# Patient Record
Sex: Female | Born: 1960 | Race: White | Hispanic: No | Marital: Single | Smoking: Never smoker
Health system: Southern US, Community
[De-identification: ages and names within clinical notes are randomized; demographics above are authoritative.]

## PROBLEM LIST (undated history)

## (undated) DIAGNOSIS — R51 Headache: Secondary | ICD-10-CM

## (undated) DIAGNOSIS — R519 Headache, unspecified: Secondary | ICD-10-CM

## (undated) DIAGNOSIS — M1990 Unspecified osteoarthritis, unspecified site: Secondary | ICD-10-CM

## (undated) DIAGNOSIS — E039 Hypothyroidism, unspecified: Secondary | ICD-10-CM

## (undated) DIAGNOSIS — I10 Essential (primary) hypertension: Secondary | ICD-10-CM

## (undated) DIAGNOSIS — T63001A Toxic effect of unspecified snake venom, accidental (unintentional), initial encounter: Secondary | ICD-10-CM

## (undated) DIAGNOSIS — Z87442 Personal history of urinary calculi: Secondary | ICD-10-CM

## (undated) DIAGNOSIS — Z8489 Family history of other specified conditions: Secondary | ICD-10-CM

## (undated) DIAGNOSIS — G4730 Sleep apnea, unspecified: Secondary | ICD-10-CM

---

## 2004-01-14 ENCOUNTER — Ambulatory Visit: Payer: Self-pay | Admitting: Internal Medicine

## 2004-01-27 ENCOUNTER — Ambulatory Visit: Payer: Self-pay | Admitting: Internal Medicine

## 2005-06-15 ENCOUNTER — Ambulatory Visit: Payer: Self-pay | Admitting: Internal Medicine

## 2005-09-27 ENCOUNTER — Ambulatory Visit: Payer: Self-pay | Admitting: Internal Medicine

## 2006-07-09 ENCOUNTER — Ambulatory Visit: Payer: Self-pay | Admitting: Nurse Practitioner

## 2006-12-31 ENCOUNTER — Ambulatory Visit: Payer: Self-pay | Admitting: Gastroenterology

## 2007-09-17 ENCOUNTER — Ambulatory Visit: Payer: Self-pay | Admitting: Nurse Practitioner

## 2008-10-29 ENCOUNTER — Ambulatory Visit: Payer: Self-pay | Admitting: Nurse Practitioner

## 2009-12-21 ENCOUNTER — Ambulatory Visit: Payer: Self-pay | Admitting: Family Medicine

## 2011-10-02 DIAGNOSIS — I10 Essential (primary) hypertension: Secondary | ICD-10-CM | POA: Insufficient documentation

## 2012-02-07 DIAGNOSIS — T63001A Toxic effect of unspecified snake venom, accidental (unintentional), initial encounter: Secondary | ICD-10-CM

## 2012-02-07 HISTORY — DX: Toxic effect of unspecified snake venom, accidental (unintentional), initial encounter: T63.001A

## 2012-10-11 ENCOUNTER — Emergency Department: Payer: Self-pay | Admitting: Emergency Medicine

## 2012-10-11 LAB — CBC WITH DIFFERENTIAL/PLATELET
Basophil %: 1 %
Eosinophil #: 0.2 10*3/uL (ref 0.0–0.7)
Eosinophil %: 4.2 %
HGB: 13.8 g/dL (ref 12.0–16.0)
Lymphocyte %: 30.3 %
MCH: 30.2 pg (ref 26.0–34.0)
Monocyte %: 7.9 %
Neutrophil #: 2.8 10*3/uL (ref 1.4–6.5)
Platelet: 234 10*3/uL (ref 150–440)
RBC: 4.56 10*6/uL (ref 3.80–5.20)
RDW: 12.6 % (ref 11.5–14.5)

## 2012-10-11 LAB — FIBRINOGEN: Fibrinogen: 276 mg/dL (ref 210–470)

## 2012-10-11 LAB — PROTIME-INR: INR: 0.9

## 2012-10-28 DIAGNOSIS — R600 Localized edema: Secondary | ICD-10-CM | POA: Insufficient documentation

## 2014-01-22 ENCOUNTER — Ambulatory Visit: Payer: Self-pay | Admitting: Unknown Physician Specialty

## 2014-05-29 DIAGNOSIS — G4733 Obstructive sleep apnea (adult) (pediatric): Secondary | ICD-10-CM | POA: Insufficient documentation

## 2014-05-30 DIAGNOSIS — E039 Hypothyroidism, unspecified: Secondary | ICD-10-CM | POA: Insufficient documentation

## 2014-05-30 DIAGNOSIS — R7402 Elevation of levels of lactic acid dehydrogenase (LDH): Secondary | ICD-10-CM | POA: Insufficient documentation

## 2014-08-20 ENCOUNTER — Other Ambulatory Visit: Payer: Self-pay | Admitting: Family Medicine

## 2014-08-20 DIAGNOSIS — Z1239 Encounter for other screening for malignant neoplasm of breast: Secondary | ICD-10-CM

## 2014-10-23 ENCOUNTER — Encounter: Payer: Self-pay | Admitting: *Deleted

## 2014-10-26 ENCOUNTER — Ambulatory Visit: Payer: BC Managed Care – PPO | Admitting: Anesthesiology

## 2014-10-26 ENCOUNTER — Encounter: Payer: Self-pay | Admitting: *Deleted

## 2014-10-26 ENCOUNTER — Encounter
Admission: RE | Disposition: A | Discharge status: Home / Self Care | Payer: Self-pay | Source: Ambulatory Visit | Attending: Unknown Physician Specialty

## 2014-10-26 ENCOUNTER — Ambulatory Visit
Admission: RE | Admit: 2014-10-26 | Discharge: 2014-10-26 | Disposition: A | Discharge status: Home / Self Care | Payer: BC Managed Care – PPO | Source: Ambulatory Visit | Attending: Unknown Physician Specialty | Admitting: Unknown Physician Specialty

## 2014-10-26 DIAGNOSIS — Z800 Family history of malignant neoplasm of digestive organs: Secondary | ICD-10-CM | POA: Insufficient documentation

## 2014-10-26 DIAGNOSIS — Z79899 Other long term (current) drug therapy: Secondary | ICD-10-CM | POA: Insufficient documentation

## 2014-10-26 DIAGNOSIS — K621 Rectal polyp: Secondary | ICD-10-CM | POA: Insufficient documentation

## 2014-10-26 DIAGNOSIS — D123 Benign neoplasm of transverse colon: Secondary | ICD-10-CM | POA: Insufficient documentation

## 2014-10-26 DIAGNOSIS — E039 Hypothyroidism, unspecified: Secondary | ICD-10-CM | POA: Diagnosis not present

## 2014-10-26 DIAGNOSIS — Z7982 Long term (current) use of aspirin: Secondary | ICD-10-CM | POA: Diagnosis not present

## 2014-10-26 DIAGNOSIS — K640 First degree hemorrhoids: Secondary | ICD-10-CM | POA: Insufficient documentation

## 2014-10-26 DIAGNOSIS — G4730 Sleep apnea, unspecified: Secondary | ICD-10-CM | POA: Diagnosis not present

## 2014-10-26 DIAGNOSIS — Z1211 Encounter for screening for malignant neoplasm of colon: Secondary | ICD-10-CM | POA: Insufficient documentation

## 2014-10-26 HISTORY — PX: COLONOSCOPY WITH PROPOFOL: SHX5780

## 2014-10-26 HISTORY — DX: Sleep apnea, unspecified: G47.30

## 2014-10-26 HISTORY — DX: Hypothyroidism, unspecified: E03.9

## 2014-10-26 SURGERY — COLONOSCOPY WITH PROPOFOL
Anesthesia: General

## 2014-10-26 MED ORDER — MIDAZOLAM HCL 5 MG/5ML IJ SOLN
INTRAMUSCULAR | Status: DC | PRN
  Administered 2014-10-26 (×2): 1 mg via INTRAVENOUS

## 2014-10-26 MED ORDER — PHENYLEPHRINE HCL 10 MG/ML IJ SOLN
INTRAMUSCULAR | Status: DC | PRN
Start: 2014-10-26 — End: 2014-10-26
  Administered 2014-10-26 (×2): 100 ug via INTRAVENOUS

## 2014-10-26 MED ORDER — SODIUM CHLORIDE 0.9 % IV SOLN: INTRAVENOUS | Status: DC

## 2014-10-26 MED ORDER — PROPOFOL INFUSION 10 MG/ML OPTIME
INTRAVENOUS | Status: DC | PRN
  Administered 2014-10-26: 100 ug/kg/min via INTRAVENOUS

## 2014-10-26 MED ORDER — FENTANYL CITRATE (PF) 100 MCG/2ML IJ SOLN
INTRAMUSCULAR | Status: DC | PRN
  Administered 2014-10-26: 50 ug via INTRAVENOUS

## 2014-10-26 MED ORDER — SODIUM CHLORIDE 0.9 % IV SOLN
INTRAVENOUS | Status: DC
  Administered 2014-10-26: 10:00:00 via INTRAVENOUS

## 2014-10-26 MED ORDER — PROPOFOL 10 MG/ML IV BOLUS
INTRAVENOUS | Status: DC | PRN
  Administered 2014-10-26: 10 mg via INTRAVENOUS
  Administered 2014-10-26: 20 mg via INTRAVENOUS
  Administered 2014-10-26: 10 mg via INTRAVENOUS
  Administered 2014-10-26: 20 mg via INTRAVENOUS
  Administered 2014-10-26 (×2): 30 mg via INTRAVENOUS
  Administered 2014-10-26: 50 mg via INTRAVENOUS

## 2014-10-26 MED ORDER — LIDOCAINE HCL (PF) 2 % IJ SOLN
INTRAMUSCULAR | Status: DC | PRN
  Administered 2014-10-26: 50 mg

## 2014-10-26 NOTE — Anesthesia Postprocedure Evaluation (Signed)
  Anesthesia Post-op Note  Patient: Theresa Thomas  Procedure(s) Performed: Procedure(s): COLONOSCOPY WITH PROPOFOL (N/A)  Anesthesia type:General  Patient location: PACU  Post pain: Pain level controlled  Post assessment: Post-op Vital signs reviewed, Patient's Cardiovascular Status Stable, Respiratory Function Stable, Patent Airway and No signs of Nausea or vomiting  Post vital signs: Reviewed and stable  Last Vitals:  Filed Vitals:   10/26/14 1042  BP: 108/78  Pulse: 103  Temp: 35.9 C  Resp: 21    Level of consciousness: awake, alert  and patient cooperative  Complications: No apparent anesthesia complications

## 2014-10-26 NOTE — Op Note (Signed)
[facility] Gastroenterology Patient Name: Theresa Thomas Procedure Date: 10/26/2014 9:55 AM MRN: [id number] Account #: [id number] Date of Birth: [birth year] Admit Type: Outpatient Age: 54 Room: [facility] ENDO ROOM 1 Gender: Female Note Status: Finalized Procedure:         Colonoscopy Indications:       Screening in patient at increased risk: Family history of                     1st-degree relative with colorectal cancer Providers:         Manya Silvas, MD Referring MD:      [facility]Barton Dubois (Referring MD) Medicines:         Propofol per Anesthesia Complications:     No immediate complications. Procedure:         Pre-Anesthesia Assessment:                    - After reviewing the risks and benefits, the patient was                     deemed in satisfactory condition to undergo the procedure.                    After obtaining informed consent, the colonoscope was                     passed under direct vision. Throughout the procedure, the                     patient's blood pressure, pulse, and oxygen saturations                     were monitored continuously. The Olympus PCF-H180AL                     colonoscope ( S#: [id number] ) was introduced through the                     anus and advanced to the the cecum, identified by                     appendiceal orifice and ileocecal valve. The colonoscopy                     was performed without difficulty. The patient tolerated                     the procedure well. The quality of the bowel preparation                     was excellent. Findings:      The colon is very long and redundant, requiring external pressure      A small polyp was found in the transverse colon. The polyp was sessile.       The polyp was removed with a jumbo cold forceps. Resection and retrieval       were complete. To prevent bleeding after the polypectomy, one hemostatic       clip was successfully placed. There was no  bleeding at the end of the       procedure.      A diminutive polyp was found in the rectum. The polyp was sessile. The       polyp was removed with a jumbo cold  forceps. Resection and retrieval       were complete.      Internal hemorrhoids were found during endoscopy. The hemorrhoids were       small and Grade I (internal hemorrhoids that do not prolapse).      The exam was otherwise without abnormality. Impression:        - One small polyp in the transverse colon. Resected and                     retrieved. Clip was placed.                    - One diminutive polyp in the rectum. Resected and                     retrieved.                    - Internal hemorrhoids.                    - The examination was otherwise normal. Recommendation:    - Await pathology results. Procedure Code(s): --- Professional ---                    [phone], Colonoscopy, flexible; with biopsy, single or                     multiple CPT copyright 2014 American Medical Association. All rights reserved. The codes documented in this report are preliminary and upon coder review may  be revised to meet current compliance requirements. Manya Silvas, MD 10/26/2014 10:43:46 AM This report has been signed electronically. Number of Addenda: 0 Note Initiated On: 10/26/2014 9:55 AM Scope Withdrawal Time: 0 hours 12 minutes 26 seconds  Total Procedure Duration: 0 hours 31 minutes 51 seconds       [facility]

## 2014-10-26 NOTE — Transfer of Care (Signed)
Immediate Anesthesia Transfer of Care Note  Patient: Theresa Thomas  Procedure(s) Performed: Procedure(s): COLONOSCOPY WITH PROPOFOL (N/A)  Patient Location: PACU  Anesthesia Type:General  Level of Consciousness: sedated  Airway & Oxygen Therapy: Patient Spontanous Breathing and Patient connected to nasal cannula oxygen  Post-op Assessment: Report given to RN and Post -op Vital signs reviewed and stable  Post vital signs: Reviewed and stable  Last Vitals:  Filed Vitals:   10/26/14 1042  BP: 108/78  Pulse: 103  Temp: 35.9 C  Resp: 21    Complications: No apparent anesthesia complications

## 2014-10-26 NOTE — Anesthesia Preprocedure Evaluation (Signed)
Anesthesia Evaluation  Patient identified by MRN, date of birth, ID band Patient awake    Reviewed: Allergy & Precautions, NPO status , Patient's Chart, lab work & pertinent test results  Airway Mallampati: II  TM Distance: >3 FB Neck ROM: Full    Dental  (+) Teeth Intact   Pulmonary sleep apnea and Continuous Positive Airway Pressure Ventilation ,    Pulmonary exam normal breath sounds clear to auscultation       Cardiovascular Exercise Tolerance: Good hypertension, Pt. on medications and Pt. on home beta blockers Normal cardiovascular exam Rhythm:Regular     Neuro/Psych    GI/Hepatic negative GI ROS,   Endo/Other  Hypothyroidism   Renal/GU      Musculoskeletal   Abdominal   Peds  Hematology   Anesthesia Other Findings   Reproductive/Obstetrics                             Anesthesia Physical Anesthesia Plan  ASA: III  Anesthesia Plan: General   Post-op Pain Management:    Induction: Intravenous  Airway Management Planned: Nasal Cannula  Additional Equipment:   Intra-op Plan:   Post-operative Plan:   Informed Consent: I have reviewed the patients History and Physical, chart, labs and discussed the procedure including the risks, benefits and alternatives for the proposed anesthesia with the patient or authorized representative who has indicated his/her understanding and acceptance.     Plan Discussed with: CRNA  Anesthesia Plan Comments:         Anesthesia Quick Evaluation

## 2014-10-26 NOTE — H&P (Signed)
   Primary Care Physician:  Lavonne Chick, MD Primary Gastroenterologist:  Dr. Vira Agar  Pre-Procedure History & Physical: HPI:  Theresa Thomas is a 54 y.o. female is here for an colonoscopy.   Past Medical History  Diagnosis Date  . Sleep apnea   . Hypothyroidism     History reviewed. No pertinent past surgical history.  Prior to Admission medications   Medication Sig Start Date End Date Taking? Authorizing Provider  aspirin EC 81 MG tablet Take 81 mg by mouth daily.   Yes Historical Provider, MD  atenolol (TENORMIN) 50 MG tablet Take 50 mg by mouth daily.   Yes Historical Provider, MD  calcium carbonate (OSCAL) 1500 (600 CA) MG TABS tablet Take by mouth daily with breakfast.   Yes Historical Provider, MD  furosemide (LASIX) 20 MG tablet Take 20 mg by mouth.   Yes Historical Provider, MD  levothyroxine (SYNTHROID, LEVOTHROID) 50 MCG tablet Take 50 mcg by mouth daily before breakfast.   Yes Historical Provider, MD  Multiple Vitamin (MULTIVITAMIN) tablet Take 1 tablet by mouth daily.   Yes Historical Provider, MD  omega-3 acid ethyl esters (LOVAZA) 1 G capsule Take by mouth daily.   Yes Historical Provider, MD  Turmeric, Curcuma [place]a, ([place]) POWD by Does not apply route.   Yes Historical Provider, MD    Allergies as of 09/23/2014  . (Not on File)    History reviewed. No pertinent family history.  Social History   Social History  . Marital Status: Married    Spouse Name: N/A  . Number of Children: N/A  . Years of Education: N/A   Occupational History  . Not on file.   Social History Main Topics  . Smoking status: Never Smoker   . Smokeless tobacco: Never Used  . Alcohol Use: 1.2 oz/week    2 Cans of beer per week  . Drug Use: No  . Sexual Activity: Not on file   Other Topics Concern  . Not on file   Social History Narrative    Review of Systems: See HPI, otherwise negative ROS  Physical Exam: BP 125/79 mmHg  Pulse 110  Temp(Src) 98.9 F  (37.2 C) (Tympanic)  Resp 20  Ht 5\' 8"  (1.727 m)  Wt 113.399 kg (250 lb)  BMI 38.02 kg/m2  SpO2 99% General:   Alert,  pleasant and cooperative in NAD Head:  Normocephalic and atraumatic. Neck:  Supple; no masses or thyromegaly. Lungs:  Clear throughout to auscultation.    Heart:  Regular rate and rhythm. Abdomen:  Soft, nontender and nondistended. Normal bowel sounds, without guarding, and without rebound.   Neurologic:  Alert and  oriented x4;  grossly normal neurologically.  Impression/Plan: Theresa Thomas is here for an colonoscopy to be performed for screening due to family history of colon cancer in mother  Risks, benefits, limitations, and alternatives regarding  colonoscopy have been reviewed with the patient.  Questions have been answered.  All parties agreeable.   Gaylyn Cheers, MD  10/26/2014, 9:52 AM

## 2014-10-26 NOTE — Addendum Note (Signed)
Addendum  created 10/26/14 1100 by Letitia Neri, CRNA   Modules edited: Anesthesia Flowsheet

## 2014-10-27 LAB — SURGICAL PATHOLOGY

## 2014-10-28 ENCOUNTER — Encounter: Payer: Self-pay | Admitting: Unknown Physician Specialty

## 2015-06-15 ENCOUNTER — Other Ambulatory Visit: Payer: Self-pay | Admitting: Orthopedic Surgery

## 2015-06-15 DIAGNOSIS — M2392 Unspecified internal derangement of left knee: Secondary | ICD-10-CM

## 2015-06-15 DIAGNOSIS — M1712 Unilateral primary osteoarthritis, left knee: Secondary | ICD-10-CM

## 2015-06-15 DIAGNOSIS — M25562 Pain in left knee: Secondary | ICD-10-CM

## 2015-06-30 ENCOUNTER — Ambulatory Visit
Admission: RE | Admit: 2015-06-30 | Discharge: 2015-06-30 | Disposition: A | Discharge status: Home / Self Care | Payer: BC Managed Care – PPO | Source: Ambulatory Visit | Attending: Orthopedic Surgery | Admitting: Orthopedic Surgery

## 2015-06-30 DIAGNOSIS — M1712 Unilateral primary osteoarthritis, left knee: Secondary | ICD-10-CM | POA: Insufficient documentation

## 2015-06-30 DIAGNOSIS — M899 Disorder of bone, unspecified: Secondary | ICD-10-CM | POA: Diagnosis not present

## 2015-06-30 DIAGNOSIS — M659 Synovitis and tenosynovitis, unspecified: Secondary | ICD-10-CM | POA: Diagnosis not present

## 2015-06-30 DIAGNOSIS — M2392 Unspecified internal derangement of left knee: Secondary | ICD-10-CM | POA: Insufficient documentation

## 2015-06-30 DIAGNOSIS — M25462 Effusion, left knee: Secondary | ICD-10-CM | POA: Diagnosis not present

## 2015-06-30 DIAGNOSIS — M23222 Derangement of posterior horn of medial meniscus due to old tear or injury, left knee: Secondary | ICD-10-CM | POA: Diagnosis not present

## 2015-06-30 DIAGNOSIS — M25562 Pain in left knee: Secondary | ICD-10-CM | POA: Insufficient documentation

## 2015-07-09 DIAGNOSIS — S83209A Unspecified tear of unspecified meniscus, current injury, unspecified knee, initial encounter: Secondary | ICD-10-CM | POA: Insufficient documentation

## 2016-01-14 NOTE — Patient Instructions (Signed)
  Your procedure is scheduled on: 01-20-16 [facility]) Report to Same Day Surgery 2nd floor medical mall [facility] Entrance-take elevator on left to 2nd floor.  Check in with surgery information desk.) To find out your arrival time please call [phone] between 1PM - 3PM on  01-19-16 [facility])  Remember: Instructions that are not followed completely may result in serious medical risk, up to and including death, or upon the discretion of your surgeon and anesthesiologist your surgery may need to be rescheduled.    _x___ 1. Do not eat food or drink liquids after midnight. No gum chewing or hard candies.     __x__ 2. No Alcohol for 24 hours before or after surgery.   __x__3. No Smoking for 24 prior to surgery.   ____  4. Bring all medications with you on the day of surgery if instructed.    __x__ 5. Notify your doctor if there is any change in your medical condition     (cold, fever, infections).     Do not wear jewelry, make-up, hairpins, clips or nail polish.  Do not wear lotions, powders, or perfumes. You may wear deodorant.  Do not shave 48 hours prior to surgery. Men may shave face and neck.  Do not bring valuables to the hospital.    [facility] is not responsible for any belongings or valuables.               Contacts, dentures or bridgework may not be worn into surgery.  Leave your suitcase in the car. After surgery it may be brought to your room.  For patients admitted to the hospital, discharge time is determined by your treatment team.   Patients discharged the day of surgery will not be allowed to drive home.  You will need someone to drive you home and stay with you the night of your procedure.    Please read over the following fact sheets that you were given:   [facility] Preparing for Surgery and or MRSA Information   _x___ Take these medicines the morning of surgery with A SIP OF WATER:    1. ATENOLOL  2.  3.  4.  5.  6.  ____Fleets enema or Magnesium  Citrate as directed.   _x___ Use CHG Soap or sage wipes as directed on instruction sheet   ____ Use inhalers on the day of surgery and bring to hospital day of surgery  ____ Stop metformin 2 days prior to surgery    ____ Take 1/2 of usual insulin dose the night before surgery and none on the morning of surgery.   _X___ Stop Aspirin, Coumadin, Pllavix ,Eliquis, Effient, or Pradaxa-STOP ASPIRIN NOW  x__ Stop Anti-inflammatories such as Advil, Aleve, Ibuprofen, Motrin, Naproxen,          Naprosyn, Goodies powders or aspirin products NOW-Ok to take Tylenol.   _X___ Stop supplements until after surgery-STOP FISH OIL AND TURMERIC NOW  ____ Bring C-Pap to the hospital.

## 2016-01-17 ENCOUNTER — Encounter
Admission: RE | Admit: 2016-01-17 | Discharge: 2016-01-17 | Disposition: A | Discharge status: Home / Self Care | Payer: BC Managed Care – PPO | Source: Ambulatory Visit | Attending: Unknown Physician Specialty | Admitting: Unknown Physician Specialty

## 2016-01-17 DIAGNOSIS — Z01810 Encounter for preprocedural cardiovascular examination: Secondary | ICD-10-CM | POA: Insufficient documentation

## 2016-01-17 DIAGNOSIS — S83242A Other tear of medial meniscus, current injury, left knee, initial encounter: Secondary | ICD-10-CM | POA: Diagnosis present

## 2016-01-17 DIAGNOSIS — E039 Hypothyroidism, unspecified: Secondary | ICD-10-CM | POA: Diagnosis not present

## 2016-01-17 DIAGNOSIS — M1712 Unilateral primary osteoarthritis, left knee: Secondary | ICD-10-CM | POA: Diagnosis not present

## 2016-01-17 DIAGNOSIS — Z6841 Body Mass Index (BMI) 40.0 and over, adult: Secondary | ICD-10-CM | POA: Diagnosis not present

## 2016-01-17 DIAGNOSIS — Z7982 Long term (current) use of aspirin: Secondary | ICD-10-CM | POA: Diagnosis not present

## 2016-01-17 DIAGNOSIS — I10 Essential (primary) hypertension: Secondary | ICD-10-CM

## 2016-01-17 DIAGNOSIS — G4730 Sleep apnea, unspecified: Secondary | ICD-10-CM | POA: Diagnosis not present

## 2016-01-17 DIAGNOSIS — Z79899 Other long term (current) drug therapy: Secondary | ICD-10-CM | POA: Diagnosis not present

## 2016-01-17 DIAGNOSIS — X58XXXA Exposure to other specified factors, initial encounter: Secondary | ICD-10-CM | POA: Diagnosis not present

## 2016-01-17 HISTORY — DX: Toxic effect of unspecified snake venom, accidental (unintentional), initial encounter: T63.001A

## 2016-01-17 HISTORY — DX: Headache: R51

## 2016-01-17 HISTORY — DX: Essential (primary) hypertension: I10

## 2016-01-17 HISTORY — DX: Headache, unspecified: R51.9

## 2016-01-17 NOTE — Pre-Procedure Instructions (Signed)
Called Dr Ronelle Nigh regarding abnormal EKG- Dr Ronelle Nigh said to send EKG over to pts PCP and have him evaluate EKG

## 2016-01-17 NOTE — Pre-Procedure Instructions (Signed)
CALLED OVER TO DR SHARPE'S OFFICE AND HAD TO LEAVE A MESSAGE WITH THE NURSE ABOUT NEEDING DR SHARPE TO LOOK AT EKG AND DETERMINE IF PT IS OK FOR SURGERY.  I FAXED EKG OVER TO OFFICE AND RECEIVED FAX CONFIRMATION THAT IT WENT THRU

## 2016-01-18 ENCOUNTER — Telehealth: Payer: Self-pay

## 2016-01-18 DIAGNOSIS — I10 Essential (primary) hypertension: Secondary | ICD-10-CM | POA: Insufficient documentation

## 2016-01-18 DIAGNOSIS — R9431 Abnormal electrocardiogram [ECG] [EKG]: Secondary | ICD-10-CM | POA: Insufficient documentation

## 2016-01-18 DIAGNOSIS — I493 Ventricular premature depolarization: Secondary | ICD-10-CM | POA: Insufficient documentation

## 2016-01-18 NOTE — Telephone Encounter (Signed)
Lmov for patient to call back  Received a referral from [facility] center for abnormal EKG

## 2016-01-18 NOTE — Pre-Procedure Instructions (Signed)
Nurse from [facility] called and said that Dr Tobin Chad looked at EKG and does not feel comfortable clearing pt for surgery.  Nurse asked if we could get cardiology to look at EKG- I told her that if Dr Tobin Chad wants pt to get cleared by cardiology then they need to refer pt to a cardiologist. Nurse said Dr Tobin Chad was out of office today but that she will speak with Dr Tobin Chad and let me know.

## 2016-01-18 NOTE — Pre-Procedure Instructions (Signed)
Theresa Thomas AND STATED THAT SHE GOT PT IN TO SEE DR Nehemiah Massed TODAY FOR CARDIAC CLEARANCE.

## 2016-01-18 NOTE — Pre-Procedure Instructions (Signed)
RECEIVED CARDIAC CLEARANCE FROM DR Hardie Lora RISK FOR SURGERY (ON CHART)

## 2016-01-18 NOTE — Pre-Procedure Instructions (Signed)
Called and left voicemail for Theresa Thomas at [facility] office regarding EKG- I faxed this over to their office with confirmation that fax was received

## 2016-01-19 NOTE — Pre-Procedure Instructions (Signed)
Progress Notes - in this encounter  Theresa Dibble, MD - 01/18/2016 11:30 AM EST Formatting of this note may be different from the original. Established Patient Visit   Chief Complaint: Chief Complaint  Patient presents with  . Follow-up  POC orthoscopic knee surgery 01/20/16  . Limb Pain  . Abnormal ECG  Date of Service: 01/18/2016 Date of Birth: [birth year] PCP: Lavonne Chick, NP  History of Present Illness: Ms. Theresa Thomas is a 55 y.o.female patient  Preventricular contractions The patient has pre ventricular contractions on Holter monitor in the past which appeared to be benign in nature and are not frequent or causing significant amount of symptoms at this time. There has been a discussion of other etiology and treatment options and we will continue to follow closely. Limb pain The patient has left lower knee extremity pain with and without physical activity. These symptoms are worsening with increased severity over the last 4 months relieved by nothing in particular. Other associated signs and symptoms include swelling, rubor, disability, instability and tenderness  Essential hypertension The patient has been on medication management listed below for essential hypertension. Reported average blood pressure readings recently have shown that the blood pressure is stable. The patient has not had any side effects of these medications at this time. We have discussed treatment goals and the patient for which they understand and agree with medication and lifestyle management. Currently there is no apparent secondary causes of the hypertension  Abnormal ECG NSR and septal ischemia with pvc and no hx of infarction in past and most consistant with abnormal lead placement Preoperative Assessment Profile for orthopedic surgery Risk factors for cardiac complication with surgery: Positive for: none Negative for: Diabetes, Cardiomyopathy or chf, Coronary artery disease, Chronic Kidney disease and  Peripheral vascular disease Active cardiovascular conditions: Positive [id number] Negative for: Angina or anginal equivalent, Recent infartion, Congestive heart failure symptoms, Dysrhythmia and Symptomatic valve disease Functional capacity >4 Mets Risk of surgery and/or procedure low risk Possible need and adjustments in therapy prior to surgery no Overall risk of cardiac complication with surgery and/or procedure is low, <1%  Past Medical and Surgical History  Past Medical History Past Medical History:  Diagnosis Date  . Hypertension  . Osteoarthritis   Past Surgical History She has a past surgical history that includes Colonoscopy (12/31/2006) and Colonoscopy (10/26/2014).   Medications and Allergies  Current Medications  Current Outpatient Prescriptions  Medication Sig Dispense Refill  . aspirin 81 MG EC tablet Take 81 mg by mouth once daily.  Marland Kitchen atenolol (TENORMIN) 50 MG tablet Take 50 mg by mouth once daily.  . calcium carbonate-vitamin D3 (CALTRATE 600+D) 600 mg(1,500mg ) -400 unit tablet Take 1 tablet by mouth once daily.  . ergocalciferol, vitamin D2, 50,000 unit capsule Take 50,000 Units by mouth once a week.  . FUROsemide (LASIX) 20 MG tablet Take 20 mg by mouth once daily as needed for Edema.  Marland Kitchen levothyroxine (SYNTHROID, LEVOTHROID) 50 MCG tablet Take 50 mcg by mouth once daily. Take on an empty stomach with a glass of water at least 30-60 minutes before breakfast.  . MULTIVITAMIN ORAL Take by mouth once daily.  Marland Kitchen omega-3 fatty acids-vitamin E 1,000 mg Take by mouth once daily.   No current facility-administered medications for this visit.   Allergies: Xylocaine [lidocaine hcl]  Social and Family History  Social History reports that she has never smoked. She has never used smokeless tobacco. She reports that she does not drink alcohol.  Family History History  reviewed. No pertinent family history.  Review of Systems   Review of Systems  Positive for limb pain  sob Negative for weight gain weight loss, weakness, vision change, hearing loss, cough, congestion, PND, orthopnea, heartburn, nausea, diaphoresis, vomiting, diarrhea, bloody stool, melena, stomach pain, extremity pain, leg weakness, leg cramping, leg blood clots, headache, blackouts, nosebleed, trouble swallowing, mouth pain, urinary frequency, urination at night, muscle weakness, skin lesions, skin rashes, tingling ,ulcers, numbness, anxiety, and/or depression Physical Examination   Vitals:BP 130/84  Pulse 68  Resp 14  Ht 172.7 cm (5\' 8" )  Wt (!) 119.3 kg (263 lb)  SpO2 100%  BMI 39.99 kg/m  Ht:172.7 cm (5\' 8" ) Wt:(!) 119.3 kg (263 lb) [id number] surface area is 2.39 meters squared. Body mass index is 39.99 kg/m. Appearance: well appearing in no acute distress HEENT: Pupils equally reactive to light and accomodation, no xanthalasma  Neck: Supple, no apparent thyromegaly, masses, or lymphadenopathy  Lungs: normal respiratory effort; no crackles, no rhonchi, no wheezes Heart: Regular rate and rhythm. Normal S1 S2 No gallops, murmur, no rub, PMI is normal size and placement. carotid upstroke normal without bruit. Jugular venous pressure is normal Abdomen: soft, nontender, not distended with normal bowel sounds. No apparent hepatosplenomegally. Abdominal aorta is normal size without bruit Extremities: no edema, no ulcers, no clubbing, no cyanosis Peripheral Pulses: 2+ in upper extremities, 2+ femoral pulses bilaterally, 2+lower extremity  Musculoskeletal; Normal muscle tone without kyphosis Neurological: Oriented and Alert, Cranial nerves intact  Assessment   55 y.o. female with  Encounter Diagnoses  Name Primary?  . Primary osteoarthritis of left knee Yes  . Frequent PVCs  . Abnormal ECG  . Benign essential HTN   Plan  -Proceed to surgery and/or invasive procedure without restriction to pre or post operative and/or procedural care. The patient is at lowest risk possible for  cardiovascular complications with surgical intervention and/or invasive procedure. Currently has no evidence active and/or significant angina and/or congestive heart failure. The patient may discontinue aspirin 7 days prior to procedure and restart at a safe period thereafter -Continue current medical regimen for hypertension control which is stable at this time and without apparent significant side effects or symptoms of medications at this time. Further treatment goals of low sodium diet for additive effects of these medications have been discussed today as well. -No further intervention of preventricular contractions which appear to be benign in nature. Continue diet and exercise as well as treatment of lifestyle actions which may help above symptoms. Other consideration of medical management as needed if patient has significant symptoms in the future. -We have had a long discussion about the benefits of physical and occupational rehabilitation. The patient is advised and encouraged to enroll for improvements in quality of life and reduced hospitalization.  Orders Placed This Encounter  Procedures  . Echo complete   Return in about 6 months (around 07/18/2016).  Theresa Dibble, MD

## 2016-01-20 ENCOUNTER — Ambulatory Visit
Admission: RE | Admit: 2016-01-20 | Discharge: 2016-01-20 | Disposition: A | Discharge status: Home / Self Care | Payer: BC Managed Care – PPO | Source: Ambulatory Visit | Attending: Unknown Physician Specialty | Admitting: Unknown Physician Specialty

## 2016-01-20 ENCOUNTER — Encounter: Payer: Self-pay | Admitting: *Deleted

## 2016-01-20 ENCOUNTER — Encounter
Admission: RE | Disposition: A | Discharge status: Home / Self Care | Payer: Self-pay | Source: Ambulatory Visit | Attending: Unknown Physician Specialty

## 2016-01-20 ENCOUNTER — Ambulatory Visit: Payer: BC Managed Care – PPO | Admitting: Anesthesiology

## 2016-01-20 DIAGNOSIS — M1712 Unilateral primary osteoarthritis, left knee: Secondary | ICD-10-CM | POA: Insufficient documentation

## 2016-01-20 DIAGNOSIS — Z79899 Other long term (current) drug therapy: Secondary | ICD-10-CM | POA: Insufficient documentation

## 2016-01-20 DIAGNOSIS — I10 Essential (primary) hypertension: Secondary | ICD-10-CM | POA: Insufficient documentation

## 2016-01-20 DIAGNOSIS — S83242A Other tear of medial meniscus, current injury, left knee, initial encounter: Secondary | ICD-10-CM | POA: Insufficient documentation

## 2016-01-20 DIAGNOSIS — Z7982 Long term (current) use of aspirin: Secondary | ICD-10-CM | POA: Insufficient documentation

## 2016-01-20 DIAGNOSIS — X58XXXA Exposure to other specified factors, initial encounter: Secondary | ICD-10-CM | POA: Insufficient documentation

## 2016-01-20 DIAGNOSIS — E039 Hypothyroidism, unspecified: Secondary | ICD-10-CM | POA: Insufficient documentation

## 2016-01-20 DIAGNOSIS — G4730 Sleep apnea, unspecified: Secondary | ICD-10-CM | POA: Insufficient documentation

## 2016-01-20 DIAGNOSIS — Z6841 Body Mass Index (BMI) 40.0 and over, adult: Secondary | ICD-10-CM | POA: Insufficient documentation

## 2016-01-20 HISTORY — PX: KNEE ARTHROSCOPY: SHX127

## 2016-01-20 SURGERY — ARTHROSCOPY, KNEE
Anesthesia: General | Laterality: Left | Wound class: Clean

## 2016-01-20 MED ORDER — PROPOFOL 10 MG/ML IV BOLUS
INTRAVENOUS | Status: DC | PRN
  Administered 2016-01-20: 200 mg via INTRAVENOUS

## 2016-01-20 MED ORDER — MIDAZOLAM HCL 2 MG/2ML IJ SOLN
INTRAMUSCULAR | Status: DC | PRN
  Administered 2016-01-20: 2 mg via INTRAVENOUS

## 2016-01-20 MED ORDER — FENTANYL CITRATE (PF) 100 MCG/2ML IJ SOLN
25.0000 ug | INTRAMUSCULAR | Status: DC | PRN
  Administered 2016-01-20 (×3): 50 ug via INTRAVENOUS

## 2016-01-20 MED ORDER — FENTANYL CITRATE (PF) 100 MCG/2ML IJ SOLN
INTRAMUSCULAR | Status: AC
  Filled 2016-01-20: qty 2

## 2016-01-20 MED ORDER — KETOROLAC TROMETHAMINE 30 MG/ML IJ SOLN
INTRAMUSCULAR | Status: DC | PRN
  Administered 2016-01-20: 30 mg via INTRAVENOUS

## 2016-01-20 MED ORDER — ONDANSETRON HCL 4 MG/2ML IJ SOLN
INTRAMUSCULAR | Status: AC
  Filled 2016-01-20: qty 2

## 2016-01-20 MED ORDER — PROMETHAZINE HCL 25 MG/ML IJ SOLN: 6.2500 mg | INTRAMUSCULAR | 0 refills | Status: DC | PRN

## 2016-01-20 MED ORDER — LACTATED RINGERS IV SOLN
INTRAVENOUS | Status: DC
  Administered 2016-01-20 (×2): via INTRAVENOUS

## 2016-01-20 MED ORDER — PROMETHAZINE HCL 25 MG/ML IJ SOLN
6.2500 mg | INTRAMUSCULAR | Status: DC | PRN
  Administered 2016-01-20: 12.5 mg via INTRAVENOUS

## 2016-01-20 MED ORDER — LIDOCAINE HCL (CARDIAC) 20 MG/ML IV SOLN
INTRAVENOUS | Status: DC | PRN
Start: 2016-01-20 — End: 2016-01-21
  Administered 2016-01-20: 40 mg via INTRAVENOUS

## 2016-01-20 MED ORDER — HYDROCODONE-ACETAMINOPHEN 5-325 MG PO TABS
ORAL_TABLET | ORAL | Status: AC
  Filled 2016-01-20: qty 1

## 2016-01-20 MED ORDER — HYDROCODONE-ACETAMINOPHEN 5-325 MG PO TABS: 1.0000 | ORAL_TABLET | Freq: Four times a day (QID) | ORAL | 0 refills | Status: DC | PRN

## 2016-01-20 MED ORDER — HYDROCODONE-ACETAMINOPHEN 5-325 MG PO TABS
1.0000 | ORAL_TABLET | Freq: Four times a day (QID) | ORAL | Status: DC | PRN
  Administered 2016-01-20: 1 via ORAL

## 2016-01-20 MED ORDER — FENTANYL CITRATE (PF) 100 MCG/2ML IJ SOLN
INTRAMUSCULAR | Status: DC | PRN
  Administered 2016-01-20: 50 ug via INTRAVENOUS
  Administered 2016-01-20 (×5): 25 ug via INTRAVENOUS

## 2016-01-20 MED ORDER — DEXAMETHASONE SODIUM PHOSPHATE 10 MG/ML IJ SOLN
INTRAMUSCULAR | Status: DC | PRN
  Administered 2016-01-20: 10 mg via INTRAVENOUS

## 2016-01-20 MED ORDER — FAMOTIDINE 20 MG PO TABS
20.0000 mg | ORAL_TABLET | Freq: Once | ORAL | Status: AC
  Administered 2016-01-20: 20 mg via ORAL

## 2016-01-20 MED ORDER — SODIUM CHLORIDE 0.9 % IJ SOLN
INTRAMUSCULAR | Status: AC
  Filled 2016-01-20: qty 10

## 2016-01-20 MED ORDER — ONDANSETRON HCL 4 MG/2ML IJ SOLN
INTRAMUSCULAR | Status: DC | PRN
  Administered 2016-01-20 (×2): 4 mg via INTRAVENOUS

## 2016-01-20 MED ORDER — PROMETHAZINE HCL 25 MG/ML IJ SOLN
INTRAMUSCULAR | Status: AC
  Filled 2016-01-20: qty 1

## 2016-01-20 MED ORDER — FAMOTIDINE 20 MG PO TABS
ORAL_TABLET | ORAL | Status: AC
  Filled 2016-01-20: qty 1

## 2016-01-20 SURGICAL SUPPLY — 40 items
ARTHROWAND PARAGON T2 (SURGICAL WAND)
BLADE ABRADER 4.5 (BLADE) ×3 IMPLANT
BLADE FULL RADIUS 3.5 (BLADE) ×3 IMPLANT
BLADE SHAVER 4.5X7 STR FR (MISCELLANEOUS) ×3 IMPLANT
BNDG ESMARK 6X12 TAN STRL LF (GAUZE/BANDAGES/DRESSINGS) ×3 IMPLANT
BUR ABRADER 4.0 W/FLUTE AQUA (MISCELLANEOUS) IMPLANT
BURR ABRADER 4.0 W/FLUTE AQUA (MISCELLANEOUS)
CHLORAPREP W/TINT 26ML (MISCELLANEOUS) ×6 IMPLANT
CUFF TOURN 24 STER (MISCELLANEOUS) IMPLANT
CUFF TOURN 34 STER (MISCELLANEOUS) ×3 IMPLANT
DRAPE LEGGINS SURG 28X43 STRL (DRAPES) ×3 IMPLANT
GAUZE SPONGE 4X4 12PLY STRL (GAUZE/BANDAGES/DRESSINGS) ×6 IMPLANT
GLOVE BIO SURGEON STRL SZ7.5 (GLOVE) ×6 IMPLANT
GLOVE BIO SURGEON STRL SZ8 (GLOVE) ×6 IMPLANT
GLOVE INDICATOR 8.0 STRL GRN (GLOVE) ×6 IMPLANT
GOWN STRL REUS W/ TWL LRG LVL3 (GOWN DISPOSABLE) ×1 IMPLANT
GOWN STRL REUS W/TWL LRG LVL3 (GOWN DISPOSABLE) ×2
GOWN STRL REUS W/TWL LRG LVL4 (GOWN DISPOSABLE) ×3 IMPLANT
IV LACTATED RINGER IRRG 3000ML (IV SOLUTION) ×4
IV LR IRRIG 3000ML ARTHROMATIC (IV SOLUTION) ×2 IMPLANT
KIT RM TURNOVER STRD PROC AR (KITS) ×3 IMPLANT
MANIFOLD 4PT FOR NEPTUNE1 (MISCELLANEOUS) ×3 IMPLANT
PACK ARTHROSCOPY KNEE (MISCELLANEOUS) ×3 IMPLANT
PADDING CAST 4IN STRL (MISCELLANEOUS) ×2
PADDING CAST BLEND 4X4 STRL (MISCELLANEOUS) ×1 IMPLANT
SET TUBE SUCT SHAVER OUTFL 24K (TUBING) ×3 IMPLANT
SET TUBE TIP INTRA-ARTICULAR (MISCELLANEOUS) ×3 IMPLANT
SOL PREP PVP 2OZ (MISCELLANEOUS) ×3
SOLUTION PREP PVP 2OZ (MISCELLANEOUS) ×1 IMPLANT
SUT ETH BLK MONO 3 0 FS 1 12/B (SUTURE) IMPLANT
SUT ETHILON 3-0 FS-10 30 BLK (SUTURE) ×3
SUTURE EHLN 3-0 FS-10 30 BLK (SUTURE) ×1 IMPLANT
TAPE MICROFOAM 4IN (TAPE) ×3 IMPLANT
TUBING ARTHRO INFLOW-ONLY STRL (TUBING) ×3 IMPLANT
WAND 30 DEG SABER W/CORD (SURGICAL WAND) IMPLANT
WAND ARTHRO PARAGON T2 (SURGICAL WAND) IMPLANT
WAND COVAC 50 IFS (MISCELLANEOUS) IMPLANT
WAND HAND CNTRL MULTIVAC 50 (MISCELLANEOUS) ×3 IMPLANT
WAND HAND CNTRL MULTIVAC 90 (MISCELLANEOUS) IMPLANT
WRAP KNEE W/COLD PACKS 25.5X14 (SOFTGOODS) ×6 IMPLANT

## 2016-01-20 NOTE — Anesthesia Procedure Notes (Signed)
Procedure Name: LMA Insertion Date/Time: 01/20/2016 9:27 AM Performed by: Allean Found Pre-anesthesia Checklist: Emergency Drugs available, Patient identified, Suction available, Patient being monitored and Timeout performed Patient Re-evaluated:Patient Re-evaluated prior to inductionOxygen Delivery Method: Circle system utilized Preoxygenation: Pre-oxygenation with 100% oxygen Intubation Type: IV induction Ventilation: Mask ventilation without difficulty LMA: LMA inserted LMA Size: 5.0 Number of attempts: 1 Placement Confirmation: positive ETCO2 and breath sounds checked- equal and bilateral Tube secured with: Tape Dental Injury: Teeth and Oropharynx as per pre-operative assessment

## 2016-01-20 NOTE — OR Nursing (Signed)
Reported 3 incisions underneath the dressing closed with stitches and covered with gauze and webril.  Ice pack to area.

## 2016-01-20 NOTE — Op Note (Signed)
Patient: Theresa Thomas, Theresa Thomas.  Preoperative diagnosis: Torn medial meniscus left knee plus medial compartment chondral pathology  Postop diagnosis: Same  Operation: Arthroscopic partial medial meniscectomy plus medial compartment chondroplasty  Surgeon: Vilinda Flake, MD  Anesthesia: Gen.   History: Patient's had a long history of left knee pain.  The plain films revealed minimal narrowing of the medial compartment .  The patient had an MRI which revealed a posterior horn tear of the medial meniscus plus early chondral changes medial compartment.The patient was scheduled for surgery due to persistent discomfort despite conservative treatment.  The patient was taken the operating room where satisfactory general anesthesia was achieved. A tourniquet and leg holder were was applied to the left thigh. A well leg support was applied to the nonoperative extremity. The left knee was prepped and draped in usual fashion for an arthroscopic procedure. An inflow cannula was introduced superomedially. The joint was distended with lactated Ringer's. Scope was introduced through an inferolateral puncture wound and a probe through an inferomedial puncture wound. Inspection of the medial compartment revealed a posterior horn root tear of the medial meniscus plus grade 2-3 chondral changes in the medial femoral condyle. The patient had a small grade 4 chondral lesion adjacent to the middle third of the medial meniscus plus grade 2-3 chondral changes in the medial tibial plateau. I went ahead and resected the torn posterior horn root tear with a basket biter and a motorized resector. The remaining rim was contoured with an angled ArthroCare wand. I debrided the chondral changes in the medial compartment with a turbo whisker. Inspection of the intercondylar notch revealed intact cruciates. Inspection of the the lateral compartment revealed a very small grade 2-3 chondral lesion in the mid weightbearing portion of the  lateral femoral condyle. I debrided this lesion with a turbo whisker..   Trochlear groove was inspected revealing some grade 2-3 chondral pathology.  Inspection of the retropatellar surface revealed no chondral pathology.  The patella seemed to track fairly well.  The instruments were removed from the joint at this time. The puncture wounds were closed with 3-0 nylon in vertical mattress fashion. Betadine was applied the wounds followed by sterile dressing. An ice pack was applied to the right knee. The patient was awakened and transferred to the stretcher bed. The patient was taken to the recovery room in satisfactory condition.  The tourniquet was not inflated during the course of the procedure. Blood loss was negligible.

## 2016-01-20 NOTE — Transfer of Care (Signed)
Immediate Anesthesia Transfer of Care Note  Patient: Theresa Thomas  Procedure(s) Performed: Procedure(s): ARTHROSCOPY KNEE, PARTIAL MEDIAL MENISCECTOMY, CHONDROPLASTY (Left)  Patient Location: PACU  Anesthesia Type:General  Level of Consciousness: awake  Airway & Oxygen Therapy: Patient Spontanous Breathing and Patient connected to face mask oxygen  Post-op Assessment: Report given to RN and Post -op Vital signs reviewed and stable  Post vital signs: Reviewed and stable  Last Vitals:  Vitals:   01/20/16 0830 01/20/16 1052  BP: 126/76 113/84  Pulse: 75 81  Resp: 18 (!) 22  Temp: 36.6 C 36.2 C    Last Pain:  Vitals:   01/20/16 0830  TempSrc: Oral  PainSc: 6          Complications: No apparent anesthesia complications

## 2016-01-20 NOTE — Anesthesia Preprocedure Evaluation (Addendum)
Anesthesia Evaluation  Patient identified by MRN, date of birth, ID band Patient awake    Reviewed: Allergy & Precautions, H&P , NPO status , Patient's Chart, lab work & pertinent test results, reviewed documented beta blocker date and time   History of Anesthesia Complications Negative for: history of anesthetic complications  Airway Mallampati: II  TM Distance: >3 FB Neck ROM: full    Dental no notable dental hx. (+) Caps   Pulmonary neg shortness of breath, sleep apnea and Continuous Positive Airway Pressure Ventilation , neg recent URI,    Pulmonary exam normal breath sounds clear to auscultation       Cardiovascular Exercise Tolerance: Good hypertension, (-) angina(-) CAD, (-) Past MI, (-) Cardiac Stents and (-) CABG Normal cardiovascular exam+ dysrhythmias (-) Valvular Problems/Murmurs Rhythm:regular Rate:Normal     Neuro/Psych  Headaches, neg Seizures negative psych ROS   GI/Hepatic negative GI ROS, Neg liver ROS,   Endo/Other  neg diabetesHypothyroidism Morbid obesity  Renal/GU negative Renal ROS  negative genitourinary   Musculoskeletal   Abdominal   Peds  Hematology negative hematology ROS (+)   Anesthesia Other Findings Past Medical History: No date: Headache     Comment: MIGRAINES No date: Hypertension No date: Hypothyroidism No date: Sleep apnea     Comment: CPAP 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   Dental Advisory Given  Plan Discussed with: Anesthesiologist, CRNA and Surgeon  Anesthesia Plan Comments:         Anesthesia Quick Evaluation

## 2016-01-20 NOTE — H&P (Signed)
  H and P reviewed. No changes. Uploaded at later date. 

## 2016-01-20 NOTE — Discharge Instructions (Signed)
[facility]  Kathrene Alu., M.D. [phone]   KNEE ARTHROSCOPY POST OPERATION INSTRUCTIONS:  PLEASE READ THESE INSTRUCTIONS ABOUT POST OPERATION CARE. THEY WILL ANSWER MOST OF YOUR QUESTIONS.  You have been given a prescription for pain. Please take as directed for pain.  You can walk, keeping the knee slightly stiff-avoid doing too much bending the first day. (if ACL reconstruction is performed, keep brace locked in extension when walking.)  You will use crutches or cane if needed. Can weight bear as tolerated  Plan to take three to four days off from work. You can resume work when you are comfortable. (This can be a week or more, depending on the type of work you do.)  To reduce pain and swelling, place one to two pillows under the knee the first two or three days when sitting or lying. An ice pack may be placed on top of the area over the dressing. Instructions for making homemade icepack are as follow:  Flexible homemade alcohol water ice pack  2 cups water  1 cup rubbing alcohol  food coloring for the blue tint (optional)  2 zip-top bags - gallon-size  Mix the water and alcohol together in one of your zip-top bags and add food coloring. Release as much air as possible and seal the bag. Place in freezer for at least 12 hours.  The small incisions in your knee are closed with nylon stitches. They will be removed in the office.  The bulky dressing may be removed in the third day after surgery. (If ACL surgery-DO NOT REMOVE BANDAGES). Put a waterproof band-aid over each stitch. Do not put any creams or ointments on wounds. You may shower at this time, but change waterproof band-aids after showering. KEEP INCISIONS CLEAN AND DRY UNTIL YOU RETURN TO THE OFFICE.  Sometimes the operative area remains somewhat painful and swollen for several weeks. This is usually nothing to worry about, but call if you have any excessive symptoms, especially  fever. It is not unusual to have a low grade fever of 99 degrees for the first few days. If persist after 3-4 days call the office. It is not uncommon for the pain to be a little worse on the third day after surgery.  Begin doing gentle exercises right away. They will be limited by the amount of pain and swelling you have.  Exercising will reduce the swelling, increase motion, and prevent muscle weakness. Exercises: Straight leg raising and gentle knee bending.  Take 81 milligram aspirin twice a day for 2 weeks after meals or milk. This along with elevation will help reduce the possibility of phlebitis in your operated leg.  Avoid strenuous athletics for a minimum of 4 to 6 weeks after arthroscopic surgery (approximately five months if ACL surgery).  If the surgery included ACL reconstruction the brace that is supplied to the extremity post surgery is to be locked in extension when you are asleep and is to be locked in extension when you are ambulating. It can be unlocked for exercises or sitting.  Keep your post surgery appointment that has been made for you. If you do not remember the date call [phone]. Your follow up appointment should be between 7-10 days.   AMBULATORY SURGERY  DISCHARGE INSTRUCTIONS   1) The drugs that you were given will stay in your system until tomorrow so for the next 24 hours you should not:  A) Drive an automobile B) Make any legal decisions  C) Drink any alcoholic beverage   2) You may resume regular meals tomorrow.  Today it is better to start with liquids and gradually work up to solid foods.  You may eat anything you prefer, but it is better to start with liquids, then soup and crackers, and gradually work up to solid foods.   3) Please notify your doctor immediately if you have any unusual bleeding, trouble breathing, redness and pain at the surgery site, drainage, fever, or pain not relieved by medication.    4) Additional  Instructions:        Please contact your physician with any problems or Same Day Surgery at [phone], Monday through Friday 6 am to 4 pm, or [place] at [facility] number at [phone].

## 2016-01-21 ENCOUNTER — Encounter: Payer: Self-pay | Admitting: Unknown Physician Specialty

## 2016-01-21 NOTE — Anesthesia Postprocedure Evaluation (Signed)
Anesthesia Post Note  Patient: Theresa Thomas  Procedure(s) Performed: Procedure(s) (LRB): ARTHROSCOPY KNEE, PARTIAL MEDIAL MENISCECTOMY, CHONDROPLASTY (Left)  Patient location during evaluation: PACU Anesthesia Type: General Level of consciousness: awake and alert Pain management: pain level controlled Vital Signs Assessment: post-procedure vital signs reviewed and stable Respiratory status: spontaneous breathing, nonlabored ventilation, respiratory function stable and patient connected to nasal cannula oxygen Cardiovascular status: blood pressure returned to baseline and stable Postop Assessment: no signs of nausea or vomiting Anesthetic complications: no    Last Vitals:  Vitals:   01/20/16 1138 01/20/16 1305  BP: 137/82 136/78  Pulse: 64 64  Resp: 15 16  Temp: 37.7 C     Last Pain:  Vitals:   01/21/16 0902  TempSrc:   PainSc: 0-No pain                 Martha Clan

## 2016-01-24 NOTE — Telephone Encounter (Signed)
Tried to lmov for patient,vm was not set up Received a referral from [facility] center for abnormal EKG

## 2016-02-01 NOTE — Telephone Encounter (Signed)
Sent letter to patient.

## 2016-02-08 DIAGNOSIS — M2392 Unspecified internal derangement of left knee: Secondary | ICD-10-CM | POA: Insufficient documentation

## 2016-06-06 DIAGNOSIS — E669 Obesity, unspecified: Secondary | ICD-10-CM | POA: Insufficient documentation

## 2016-08-16 ENCOUNTER — Emergency Department: Payer: BC Managed Care – PPO

## 2016-08-16 ENCOUNTER — Emergency Department
Admission: EM | Admit: 2016-08-16 | Discharge: 2016-08-16 | Disposition: A | Discharge status: Home / Self Care | Payer: BC Managed Care – PPO | Attending: Student in an Organized Health Care Education/Training Program | Admitting: Student in an Organized Health Care Education/Training Program

## 2016-08-16 ENCOUNTER — Encounter: Payer: Self-pay | Admitting: Emergency Medicine

## 2016-08-16 DIAGNOSIS — R109 Unspecified abdominal pain: Secondary | ICD-10-CM | POA: Diagnosis present

## 2016-08-16 DIAGNOSIS — Z7982 Long term (current) use of aspirin: Secondary | ICD-10-CM | POA: Diagnosis not present

## 2016-08-16 DIAGNOSIS — I10 Essential (primary) hypertension: Secondary | ICD-10-CM | POA: Insufficient documentation

## 2016-08-16 DIAGNOSIS — Z79899 Other long term (current) drug therapy: Secondary | ICD-10-CM | POA: Insufficient documentation

## 2016-08-16 DIAGNOSIS — N201 Calculus of ureter: Secondary | ICD-10-CM | POA: Insufficient documentation

## 2016-08-16 DIAGNOSIS — E039 Hypothyroidism, unspecified: Secondary | ICD-10-CM | POA: Diagnosis not present

## 2016-08-16 LAB — CBC
HEMATOCRIT: 40.7 % (ref 35.0–47.0)
HEMOGLOBIN: 14.2 g/dL (ref 12.0–16.0)
MCH: 28.4 pg (ref 26.0–34.0)
MCHC: 34.9 g/dL (ref 32.0–36.0)
MCV: 81.4 fL (ref 80.0–100.0)
Platelets: 264 10*3/uL (ref 150–440)
RBC: 5 MIL/uL (ref 3.80–5.20)
RDW: 12.7 % (ref 11.5–14.5)
WBC: 10.9 10*3/uL (ref 3.6–11.0)

## 2016-08-16 LAB — URINALYSIS, COMPLETE (UACMP) WITH MICROSCOPIC
BACTERIA UA: NONE SEEN
BILIRUBIN URINE: NEGATIVE
GLUCOSE, UA: NEGATIVE mg/dL
KETONES UR: NEGATIVE mg/dL
NITRITE: NEGATIVE
PH: 5 (ref 5.0–8.0)
PROTEIN: NEGATIVE mg/dL
Specific Gravity, Urine: 1.02 (ref 1.005–1.030)

## 2016-08-16 LAB — COMPREHENSIVE METABOLIC PANEL
ALBUMIN: 4.6 g/dL (ref 3.5–5.0)
ALT: 111 U/L — ABNORMAL HIGH (ref 14–54)
ANION GAP: 13 (ref 5–15)
AST: 76 U/L — ABNORMAL HIGH (ref 15–41)
Alkaline Phosphatase: 115 U/L (ref 38–126)
BUN: 20 mg/dL (ref 6–20)
CO2: 21 mmol/L — ABNORMAL LOW (ref 22–32)
Calcium: 9.4 mg/dL (ref 8.9–10.3)
Chloride: 104 mmol/L (ref 101–111)
Creatinine, Ser: 1.12 mg/dL — ABNORMAL HIGH (ref 0.44–1.00)
GFR calc non Af Amer: 55 mL/min — ABNORMAL LOW (ref >60)
GLUCOSE: 137 mg/dL — AB (ref 65–99)
POTASSIUM: 4.1 mmol/L (ref 3.5–5.1)
SODIUM: 138 mmol/L (ref 135–145)
TOTAL PROTEIN: 8.1 g/dL (ref 6.5–8.1)
Total Bilirubin: 1 mg/dL (ref 0.3–1.2)

## 2016-08-16 LAB — LIPASE, BLOOD: Lipase: 34 U/L (ref 11–51)

## 2016-08-16 MED ORDER — SODIUM CHLORIDE 0.9 % IV BOLUS (SEPSIS)
1000.0000 mL | Freq: Once | INTRAVENOUS | Status: AC
Start: 2016-08-16 — End: 2016-08-16
  Administered 2016-08-16: 1000 mL via INTRAVENOUS
  Filled 2016-08-16: qty 1000

## 2016-08-16 MED ORDER — TAMSULOSIN HCL 0.4 MG PO CAPS: 0.4000 mg | ORAL_CAPSULE | Freq: Every day | ORAL | 0 refills | Status: DC

## 2016-08-16 MED ORDER — ONDANSETRON HCL 4 MG/2ML IJ SOLN
4.0000 mg | Freq: Once | INTRAMUSCULAR | Status: AC
  Administered 2016-08-16: 4 mg via INTRAVENOUS
  Filled 2016-08-16: qty 2

## 2016-08-16 MED ORDER — PROMETHAZINE HCL 25 MG RE SUPP: 25.0000 mg | Freq: Four times a day (QID) | RECTAL | 1 refills | Status: DC | PRN

## 2016-08-16 MED ORDER — HYDROCODONE-ACETAMINOPHEN 5-325 MG PO TABS: 1.0000 | ORAL_TABLET | ORAL | 0 refills | Status: DC | PRN

## 2016-08-16 MED ORDER — ACETAMINOPHEN 500 MG PO TABS
1000.0000 mg | ORAL_TABLET | Freq: Once | ORAL | Status: AC
  Administered 2016-08-16: 1000 mg via ORAL
  Filled 2016-08-16: qty 2

## 2016-08-16 MED ORDER — KETOROLAC TROMETHAMINE 30 MG/ML IJ SOLN
15.0000 mg | Freq: Once | INTRAMUSCULAR | Status: AC
  Administered 2016-08-16: 15 mg via INTRAVENOUS
  Filled 2016-08-16: qty 1

## 2016-08-16 MED ORDER — MORPHINE SULFATE (PF) 4 MG/ML IV SOLN
4.0000 mg | INTRAVENOUS | Status: DC | PRN
  Administered 2016-08-16: 4 mg via INTRAVENOUS
  Filled 2016-08-16: qty 1

## 2016-08-16 NOTE — ED Provider Notes (Signed)
[facility] Emergency Department Provider Note    First MD Initiated Contact with Patient 08/16/16 [phone]     (approximate)  I have reviewed the triage vital signs and the nursing notes.   HISTORY  Chief Complaint Abdominal Pain    HPI Theresa Thomas is a 56 y.o. female presents from home with intermittent right flank pain and was very severe last night actually woke her from sleep. No measured fevers. Pain did radiate down the right lower quadrant area. Denies any hematuria or dysuria. No known history of stones. No trauma. Had an episode of nausea with nonbilious nonbloody vomiting associated with the pain. Denies any shortness of breath.   Past Medical History:  Diagnosis Date  . Headache    MIGRAINES  . Hypertension   . Hypothyroidism   . Sleep apnea    CPAP  . 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 Past Surgical History:  Procedure Laterality Date  . COLONOSCOPY WITH PROPOFOL N/A 10/26/2014   Procedure: COLONOSCOPY WITH PROPOFOL;  Surgeon: Manya Silvas, MD;  Location: [facility] ENDOSCOPY;  Service: Endoscopy;  Laterality: N/A;  . KNEE ARTHROSCOPY Left 01/20/2016   Procedure: ARTHROSCOPY KNEE, PARTIAL MEDIAL MENISCECTOMY, CHONDROPLASTY;  Surgeon: Leanor Kail, MD;  Location: [facility] ORS;  Service: Orthopedics;  Laterality: Left;   There are no active problems to display for this patient.     Prior to Admission medications   Medication Sig Start Date End Date Taking? Authorizing Provider  aspirin EC 81 MG tablet Take 81 mg by mouth daily.   Yes [provider]  atenolol (TENORMIN) 50 MG tablet Take 50 mg by mouth every morning.    Yes [provider]  acetaminophen (TYLENOL) 500 MG tablet Take 1,500 mg by mouth daily as needed for moderate pain or headache.    [provider]  aspirin-acetaminophen-caffeine (EXCEDRIN MIGRAINE) [phone] MG tablet Take 3 tablets by mouth 2 (two) times  daily as needed for headache.    [provider]  HYDROcodone-acetaminophen (NORCO) 5-325 MG tablet Take 1-2 tablets by mouth every 6 (six) hours as needed for moderate pain. MAXIMUM TOTAL ACETAMINOPHEN DOSE IS 4000 MG PER DAY Patient not taking: Reported on 08/16/2016 01/20/16   Leanor Kail, MD  HYDROcodone-acetaminophen (NORCO) 5-325 MG tablet Take 1 tablet by mouth every 4 (four) hours as needed for moderate pain. 08/16/16   Merlyn Lot, MD  ibuprofen (ADVIL,MOTRIN) 200 MG tablet Take 600 mg by mouth 2 (two) times daily as needed (knee pain).    [provider]  levothyroxine (SYNTHROID, LEVOTHROID) 50 MCG tablet Take 50 mcg by mouth at bedtime.     [provider]  naproxen sodium (ANAPROX) 220 MG tablet Take 220 mg by mouth daily as needed (knee pain).    [provider]  promethazine (PHENERGAN) 25 MG suppository Place 1 suppository (25 mg total) rectally every 6 (six) hours as needed for nausea. 08/16/16 08/16/17  Merlyn Lot, MD  promethazine (PHENERGAN) 25 MG/ML injection Inject 0.25-0.5 mLs (6.25-12.5 mg total) into the vein every 15 (fifteen) minutes as needed for nausea. Patient not taking: Reported on 08/16/2016 01/20/16   Leanor Kail, MD  tamsulosin (FLOMAX) 0.4 MG CAPS capsule Take 1 capsule (0.4 mg total) by mouth daily after supper. 08/16/16   Merlyn Lot, MD    Allergies Lidocaine hcl    Social History Social History  Substance Use Topics  . Smoking status: Never Smoker  .  Smokeless tobacco: Never Used  . Alcohol use 1.2 oz/week    2 Cans of beer per week     Comment: OCC    Review of Systems Patient denies headaches, rhinorrhea, blurry vision, numbness, shortness of breath, chest pain, edema, cough, abdominal pain, nausea, vomiting, diarrhea, dysuria, fevers, rashes or hallucinations unless otherwise stated above in HPI. ____________________________________________   PHYSICAL EXAM:  VITAL SIGNS: Vitals:     08/16/16 1100 08/16/16 1130  BP: 105/62 (!) 101/58  Pulse: 61 65  Resp: 17 17    Constitutional: Alert and oriented. Well appearing and in no acute distress. Eyes: Conjunctivae are normal.  Head: Atraumatic. Nose: No congestion/rhinnorhea. Mouth/Throat: Mucous membranes are moist.   Neck: No stridor. Painless ROM.  Cardiovascular: Normal rate, regular rhythm. Grossly normal heart sounds.  Good peripheral circulation. Respiratory: Normal respiratory effort.  No retractions. Lungs CTAB. Gastrointestinal: Soft and nontender. No distention. No abdominal bruits. + right CVA tenderness. Genitourinary:  Musculoskeletal: No lower extremity tenderness nor edema.  No joint effusions. Neurologic:  Normal speech and language. No gross focal neurologic deficits are appreciated. No facial droop Skin:  Skin is warm, dry and intact. No rash noted. Psychiatric: Mood and affect are normal. Speech and behavior are normal.  ____________________________________________   LABS (all labs ordered are listed, but only abnormal results are displayed)  Results for orders placed or performed during the hospital encounter of 08/16/16 (from the past 24 hour(s))  Lipase, blood     Status: None   Collection Time: 08/16/16  7:54 AM  Result Value Ref Range   Lipase 34 11 - 51 U/L  Comprehensive metabolic panel     Status: Abnormal   Collection Time: 08/16/16  7:54 AM  Result Value Ref Range   Sodium 138 135 - 145 mmol/L   Potassium 4.1 3.5 - 5.1 mmol/L   Chloride 104 101 - 111 mmol/L   CO2 21 (L) 22 - 32 mmol/L   Glucose, Bld 137 (H) 65 - 99 mg/dL   BUN 20 6 - 20 mg/dL   Creatinine, Ser 1.12 (H) 0.44 - 1.00 mg/dL   Calcium 9.4 8.9 - 10.3 mg/dL   Total Protein 8.1 6.5 - 8.1 g/dL   Albumin 4.6 3.5 - 5.0 g/dL   AST 76 (H) 15 - 41 U/L   ALT 111 (H) 14 - 54 U/L   Alkaline Phosphatase 115 38 - 126 U/L   Total Bilirubin 1.0 0.3 - 1.2 mg/dL   GFR calc non Af Amer 55 (L) >60 mL/min   GFR calc Af Amer >60  >60 mL/min   Anion gap 13 5 - 15  CBC     Status: None   Collection Time: 08/16/16  7:54 AM  Result Value Ref Range   WBC 10.9 3.6 - 11.0 K/uL   RBC 5.00 3.80 - 5.20 MIL/uL   Hemoglobin 14.2 12.0 - 16.0 g/dL   HCT 40.7 35.0 - 47.0 %   MCV 81.4 80.0 - 100.0 fL   MCH 28.4 26.0 - 34.0 pg   MCHC 34.9 32.0 - 36.0 g/dL   RDW 12.7 11.5 - 14.5 %   Platelets 264 150 - 440 K/uL  Urinalysis, Complete w Microscopic     Status: Abnormal   Collection Time: 08/16/16 10:21 AM  Result Value Ref Range   Color, Urine YELLOW (A) YELLOW   APPearance HAZY (A) CLEAR   Specific Gravity, Urine 1.020 1.005 - 1.030   pH 5.0 5.0 - 8.0   Glucose,  UA NEGATIVE NEGATIVE mg/dL   Hgb urine dipstick MODERATE (A) NEGATIVE   Bilirubin Urine NEGATIVE NEGATIVE   Ketones, ur NEGATIVE NEGATIVE mg/dL   Protein, ur NEGATIVE NEGATIVE mg/dL   Nitrite NEGATIVE NEGATIVE   Leukocytes, UA LARGE (A) NEGATIVE   RBC / HPF 6-30 0 - 5 RBC/hpf   WBC, UA 6-30 0 - 5 WBC/hpf   Bacteria, UA NONE SEEN NONE SEEN   Squamous Epithelial / LPF 0-5 (A) NONE SEEN   Mucous PRESENT    Ca Oxalate Crys, UA PRESENT    ____________________________________________ ___________________________________  RADIOLOGY  I personally reviewed all radiographic images ordered to evaluate for the above acute complaints and reviewed radiology reports and findings.  These findings were personally discussed with the patient.  Please see medical record for radiology report.  ____________________________________________   PROCEDURES  Procedure(s) performed:  Procedures    Critical Care performed: no ____________________________________________   INITIAL IMPRESSION / ASSESSMENT AND PLAN / ED COURSE  Pertinent labs & imaging results that were available during my care of the patient were reviewed by me and considered in my medical decision making (see chart for details).  DDX: stone, appy, msdk strain, pyelo, cyst, contusion   Theresa Thomas is  a 56 y.o. who presents to the ED with acute right flank pain. No fevers, no systemic symptoms. - urinary symptoms. Denies trauma or injury. Afebrile in ED. Exam as above. Flank TTP, otherwise abdominal exam is benign. No peritoneal signs. Possible kidney stone, cystitis, or pyelonephritis. Clinical picture is not consistent with appendicitis, diverticulitis, pancreatitis, cholecystitis, bowel perforation, aortic dissection, splenic injury or acute abdominal process at this time.  Clinical Course as of Aug 17 1443  Wed Aug 16, 2016  1031 Patient reassessed. Discussed results of CT imaging at bedside with patient. Currently awaiting on urinalysis.  [PR]    Clinical Course User Index [PR] Merlyn Lot, MD    Pain improved, tolerating PO.  UA with hematuria but no evidence of infection.  Repeat ABD exam benign, will plan supportive treatment and early follow up with pcp and urology.  Have discussed with the patient and available family all diagnostics and treatments performed thus far and all questions were answered to the best of my ability. The patient demonstrates understanding and agreement with plan.     ____________________________________________   FINAL CLINICAL IMPRESSION(S) / ED DIAGNOSES  Final diagnoses:  Ureterolithiasis  Acute right flank pain      NEW MEDICATIONS STARTED DURING THIS VISIT:  Discharge Medication List as of 08/16/2016 11:57 AM    START taking these medications   Details  !! HYDROcodone-acetaminophen (NORCO) 5-325 MG tablet Take 1 tablet by mouth every 4 (four) hours as needed for moderate pain., Starting Wed 08/16/2016, Print    promethazine (PHENERGAN) 25 MG suppository Place 1 suppository (25 mg total) rectally every 6 (six) hours as needed for nausea., Starting Wed 08/16/2016, Until Thu 08/16/2017, Print    tamsulosin (FLOMAX) 0.4 MG CAPS capsule Take 1 capsule (0.4 mg total) by mouth daily after supper., Starting Wed 08/16/2016, Print     !! -  Potential duplicate medications found. Please discuss with provider.       Note:  This document was prepared using Dragon voice recognition software and may include unintentional dictation errors.    Merlyn Lot, MD 08/16/16 [phone]

## 2016-08-16 NOTE — ED Triage Notes (Signed)
Patient presents to the ED with right lower quadrant/side pain that began yesterday at 4pm.  Patient reports pain is constant but will occasionally become severe.  Patient reports pain has caused vomiting x 2 in the past 24 hours.  Patient states she had the pain about 1 week ago as well with vomiting but that episode resolved.  Patient denies diarrhea.  Patient is in no obvious distress at this time.

## 2016-09-05 ENCOUNTER — Ambulatory Visit (INDEPENDENT_AMBULATORY_CARE_PROVIDER_SITE_OTHER): Payer: BC Managed Care – PPO | Admitting: Urology

## 2016-09-05 ENCOUNTER — Encounter: Payer: Self-pay | Admitting: Urology

## 2016-09-05 DIAGNOSIS — N200 Calculus of kidney: Secondary | ICD-10-CM | POA: Insufficient documentation

## 2016-09-05 LAB — URINALYSIS, COMPLETE
Bilirubin, UA: NEGATIVE
Glucose, UA: NEGATIVE
Ketones, UA: NEGATIVE
Nitrite, UA: NEGATIVE
Protein, UA: NEGATIVE
RBC, UA: NEGATIVE
Specific Gravity, UA: 1.025 (ref 1.005–1.030)
Urobilinogen, Ur: 0.2 mg/dL (ref 0.2–1.0)
pH, UA: 5 (ref 5.0–7.5)

## 2016-09-05 LAB — MICROSCOPIC EXAMINATION

## 2016-09-05 NOTE — Progress Notes (Signed)
09/05/2016 7:21 AM   Theresa Thomas 1960/11/29 [id number]  Referring provider: Lavonne Chick, MD [place], [place] [address]  CC: New patient with small ureteral stone  HPI:  1 - Urolithiasis - 68mm Rt UVJ stone with mild hydro as well as several RLP punctate renal stoens (total vol 45mm or so) on ER CT 08/2016 on eval Rt flank pain. UA without infectious parameters. Cr 1.12.  Given trial of medical therapy with pain meds, tamsulosin, nausea meds and passed. She brings with her today.   PMH sig for hypothyroid, migraine, knee surgery. No ischemic CV disease / blood thinners.  Today "Theresa Thomas" is seen as new patient for above. She has now passed her small distal ureteral stone.    PMH: Past Medical History:  Diagnosis Date  . Headache    MIGRAINES  . Hypertension   . Hypothyroidism   . Sleep apnea    CPAP  . Snake bite poisoning 2014   [place]     Surgical History: Past Surgical History:  Procedure Laterality Date  . COLONOSCOPY WITH PROPOFOL N/A 10/26/2014   Procedure: COLONOSCOPY WITH PROPOFOL;  Surgeon: Manya Silvas, MD;  Location: [facility] ENDOSCOPY;  Service: Endoscopy;  Laterality: N/A;  . KNEE ARTHROSCOPY Left 01/20/2016   Procedure: ARTHROSCOPY KNEE, PARTIAL MEDIAL MENISCECTOMY, CHONDROPLASTY;  Surgeon: Leanor Kail, MD;  Location: [facility] ORS;  Service: Orthopedics;  Laterality: Left;    Home Medications:  Allergies as of 09/05/2016      Reactions   Lidocaine Hcl Rash      Medication List       Accurate as of 09/05/16  7:21 AM. Always use your most recent med list.          acetaminophen 500 MG tablet Commonly known as:  TYLENOL Take 1,500 mg by mouth daily as needed for moderate pain or headache.   aspirin EC 81 MG tablet Take 81 mg by mouth daily.   aspirin-acetaminophen-caffeine 250-250-65 MG tablet Commonly known as:  EXCEDRIN MIGRAINE Take 3 tablets by mouth 2 (two) times daily as needed for headache.   atenolol 50 MG  tablet Commonly known as:  TENORMIN Take 50 mg by mouth every morning.   HYDROcodone-acetaminophen 5-325 MG tablet Commonly known as:  NORCO Take 1-2 tablets by mouth every 6 (six) hours as needed for moderate pain. MAXIMUM TOTAL ACETAMINOPHEN DOSE IS 4000 MG PER DAY   HYDROcodone-acetaminophen 5-325 MG tablet Commonly known as:  NORCO Take 1 tablet by mouth every 4 (four) hours as needed for moderate pain.   ibuprofen 200 MG tablet Commonly known as:  ADVIL,MOTRIN Take 600 mg by mouth 2 (two) times daily as needed (knee pain).   levothyroxine 50 MCG tablet Commonly known as:  SYNTHROID, LEVOTHROID Take 50 mcg by mouth at bedtime.   naproxen sodium 220 MG tablet Commonly known as:  ANAPROX Take 220 mg by mouth daily as needed (knee pain).   promethazine 25 MG/ML injection Commonly known as:  PHENERGAN Inject 0.25-0.5 mLs (6.25-12.5 mg total) into the vein every 15 (fifteen) minutes as needed for nausea.   promethazine 25 MG suppository Commonly known as:  PHENERGAN Place 1 suppository (25 mg total) rectally every 6 (six) hours as needed for nausea.   tamsulosin 0.4 MG Caps capsule Commonly known as:  FLOMAX Take 1 capsule (0.4 mg total) by mouth daily after supper.       Allergies:  Allergies  Allergen Reactions  . Lidocaine Hcl Rash  Family History: No family history on file.  Social History:  reports that she has never smoked. She has never used smokeless tobacco. She reports that she drinks about 1.2 oz of alcohol per week . She reports that she does not use drugs.   Review of Systems  Gastrointestinal (upper)  : Negative for upper GI symptoms  Gastrointestinal (lower) : Negative for lower GI symptoms  Constitutional : Negative for symptoms  Skin: Negative for skin symptoms  Eyes: Negative for eye symptoms  Ear/Nose/Throat : Negative for Ear/Nose/Throat symptoms  Hematologic/Lymphatic: Negative for Hematologic/Lymphatic  symptoms  Cardiovascular : Negative for cardiovascular symptoms  Respiratory : Negative for respiratory symptoms  Endocrine: Negative for endocrine symptoms  Musculoskeletal: Negative for musculoskeletal symptoms  Neurological: Negative for neurological symptoms  Psychologic: Negative for psychiatric symptoms   Physical Exam: There were no vitals taken for this visit.  Constitutional:  Alert and oriented, No acute distress. HEENT: [place] AT, moist mucus membranes.  Trachea midline, no masses. Cardiovascular: No clubbing, cyanosis, or edema. Respiratory: Normal respiratory effort, no increased work of breathing. GI: Abdomen is soft, nontender, nondistended, no abdominal masses. Mild truncal obesity.  GU: No CVA tenderness.  Skin: No rashes, bruises or suspicious lesions. Lymph: No cervical or inguinal adenopathy. Neurologic: Grossly intact, no focal deficits, moving all 4 extremities. Psychiatric: Normal mood and affect.  Laboratory Data: Lab Results  Component Value Date   WBC 10.9 08/16/2016   HGB 14.2 08/16/2016   HCT 40.7 08/16/2016   MCV 81.4 08/16/2016   PLT 264 08/16/2016    Lab Results  Component Value Date   CREATININE 1.12 (H) 08/16/2016    No results found for: PSA  No results found for: TESTOSTERONE  No results found for: HGBA1C  Urinalysis    Component Value Date/Time   COLORURINE YELLOW (A) 08/16/2016 1021   APPEARANCEUR HAZY (A) 08/16/2016 1021   LABSPEC 1.020 08/16/2016 1021   PHURINE 5.0 08/16/2016 1021   GLUCOSEU NEGATIVE 08/16/2016 1021   HGBUR MODERATE (A) 08/16/2016 1021   BILIRUBINUR NEGATIVE 08/16/2016 1021   KETONESUR NEGATIVE 08/16/2016 1021   PROTEINUR NEGATIVE 08/16/2016 1021   NITRITE NEGATIVE 08/16/2016 1021   LEUKOCYTESUR LARGE (A) 08/16/2016 1021    Pertinent Imaging: Ct form ER reviewed independently as per above.   Assessment & Plan:    1. Nephrolithiasis - now s/p medical passage of first stone. Discussed approx  50% future lifetime recurrence risk and general dietary recs (high volume, high citrate, low salt) to help minimize risk. We discussed that she still has few very small intra-renal stones but that pre-emptive treatment not recommended as she is not seeking elective pregnancy or extended foreign travel.  RTC Urol PRN.    Alexis Frock, [facility] [address] [place], [place] [address] [phone]

## 2016-09-21 ENCOUNTER — Other Ambulatory Visit: Payer: Self-pay | Admitting: Family Medicine

## 2016-09-21 DIAGNOSIS — Z1231 Encounter for screening mammogram for malignant neoplasm of breast: Secondary | ICD-10-CM

## 2016-10-06 ENCOUNTER — Ambulatory Visit
Admission: RE | Admit: 2016-10-06 | Discharge: 2016-10-06 | Disposition: A | Discharge status: Home / Self Care | Payer: BC Managed Care – PPO | Source: Ambulatory Visit | Attending: Family Medicine | Admitting: Family Medicine

## 2016-10-06 DIAGNOSIS — Z1231 Encounter for screening mammogram for malignant neoplasm of breast: Secondary | ICD-10-CM | POA: Diagnosis not present

## 2017-02-06 HISTORY — PX: REPLACEMENT TOTAL KNEE: SUR1224

## 2017-03-09 DIAGNOSIS — R3121 Asymptomatic microscopic hematuria: Secondary | ICD-10-CM | POA: Insufficient documentation

## 2017-10-12 DIAGNOSIS — M1711 Unilateral primary osteoarthritis, right knee: Secondary | ICD-10-CM | POA: Insufficient documentation

## 2017-10-15 ENCOUNTER — Other Ambulatory Visit: Payer: Self-pay | Admitting: Unknown Physician Specialty

## 2017-10-15 DIAGNOSIS — M25362 Other instability, left knee: Secondary | ICD-10-CM

## 2017-10-15 DIAGNOSIS — M25562 Pain in left knee: Secondary | ICD-10-CM

## 2017-10-15 DIAGNOSIS — G8929 Other chronic pain: Secondary | ICD-10-CM

## 2017-10-30 ENCOUNTER — Ambulatory Visit
Admission: RE | Admit: 2017-10-30 | Discharge: 2017-10-30 | Disposition: A | Discharge status: Home / Self Care | Payer: BC Managed Care – PPO | Source: Ambulatory Visit | Attending: Unknown Physician Specialty | Admitting: Unknown Physician Specialty

## 2017-10-30 DIAGNOSIS — M25462 Effusion, left knee: Secondary | ICD-10-CM | POA: Insufficient documentation

## 2017-10-30 DIAGNOSIS — G8929 Other chronic pain: Secondary | ICD-10-CM | POA: Insufficient documentation

## 2017-10-30 DIAGNOSIS — M25561 Pain in right knee: Secondary | ICD-10-CM | POA: Insufficient documentation

## 2017-10-30 DIAGNOSIS — R937 Abnormal findings on diagnostic imaging of other parts of musculoskeletal system: Secondary | ICD-10-CM | POA: Insufficient documentation

## 2017-10-30 DIAGNOSIS — M25362 Other instability, left knee: Secondary | ICD-10-CM | POA: Diagnosis not present

## 2017-10-30 DIAGNOSIS — M25562 Pain in left knee: Secondary | ICD-10-CM | POA: Insufficient documentation

## 2018-01-07 DIAGNOSIS — Z96652 Presence of left artificial knee joint: Secondary | ICD-10-CM | POA: Insufficient documentation

## 2018-04-08 DIAGNOSIS — E782 Mixed hyperlipidemia: Secondary | ICD-10-CM | POA: Insufficient documentation

## 2018-07-11 IMAGING — MG MM DIGITAL SCREENING BILAT W/ CAD
4 series · 4 of 4 positions shown · non-contrast
Comparison: Previous exam(s).

CLINICAL DATA: Screening.

EXAM:
DIGITAL SCREENING BILATERAL MAMMOGRAM WITH CAD

[L CC]
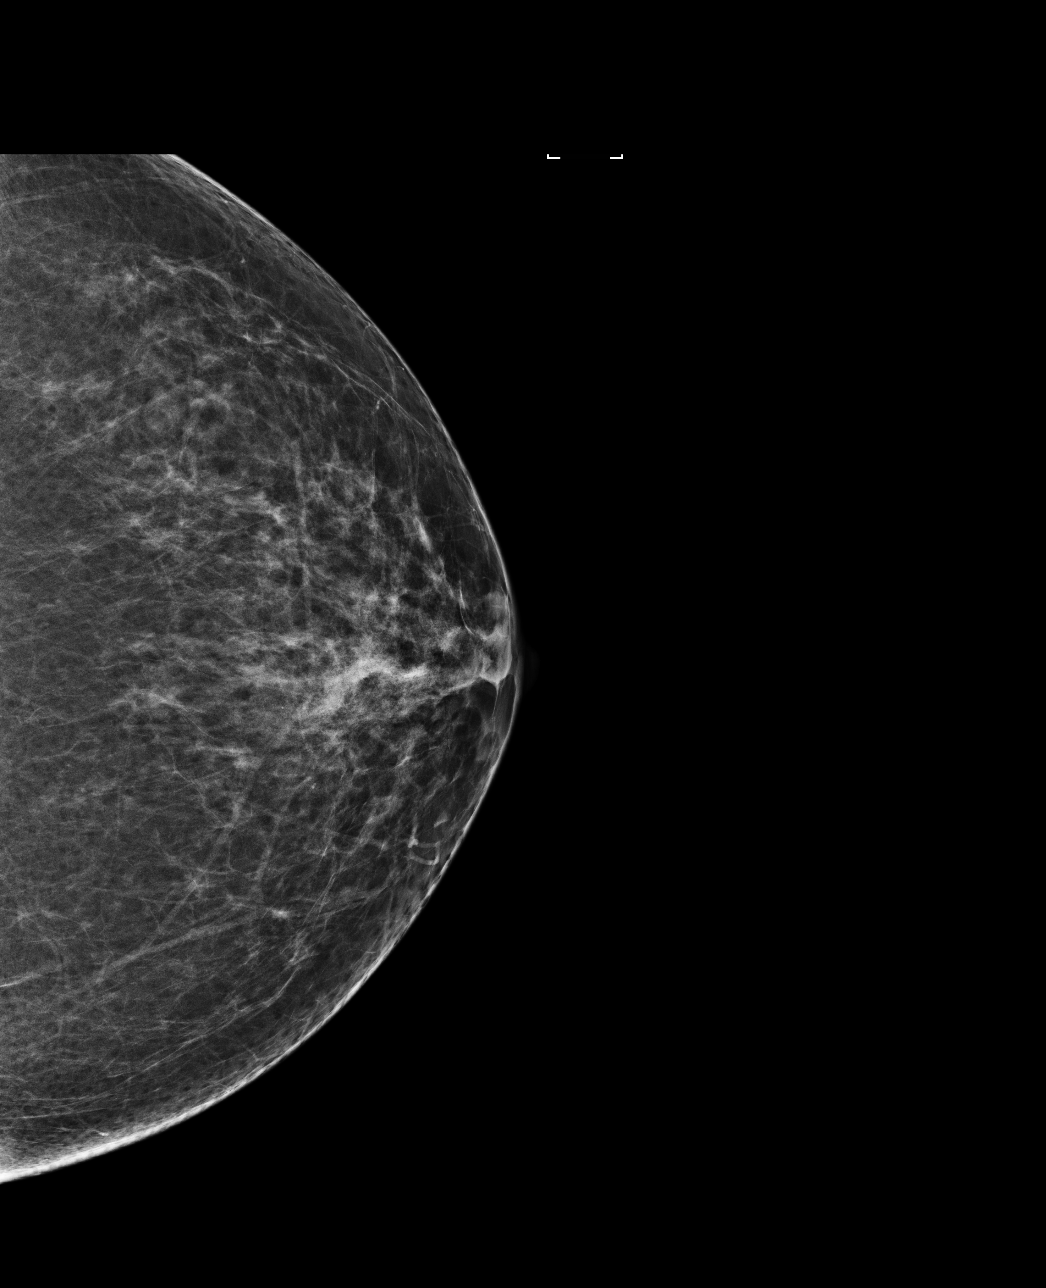

[L MLO]
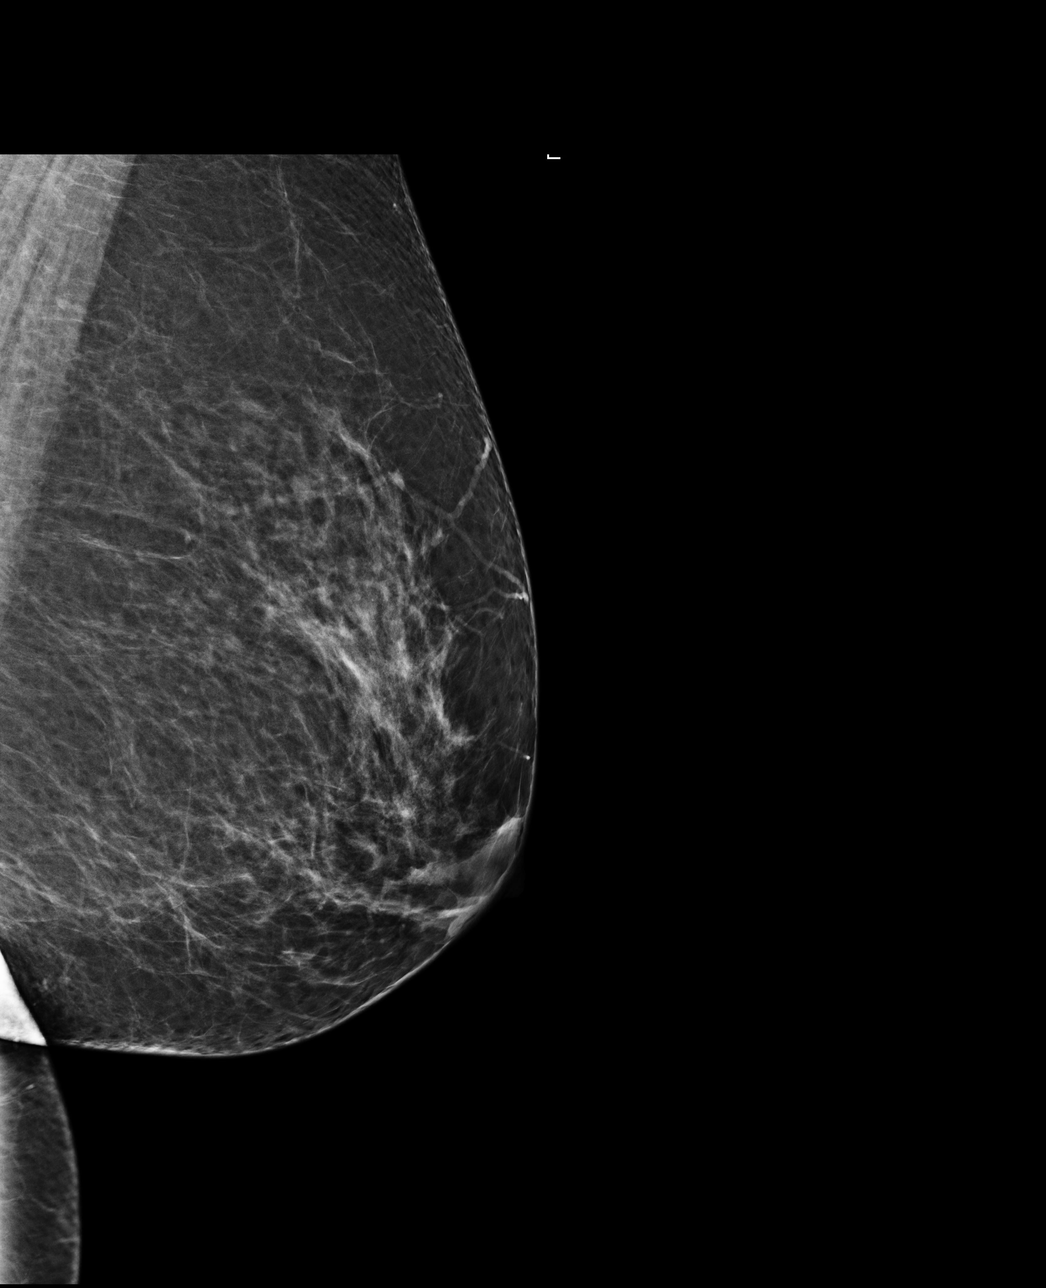

[R MLO]
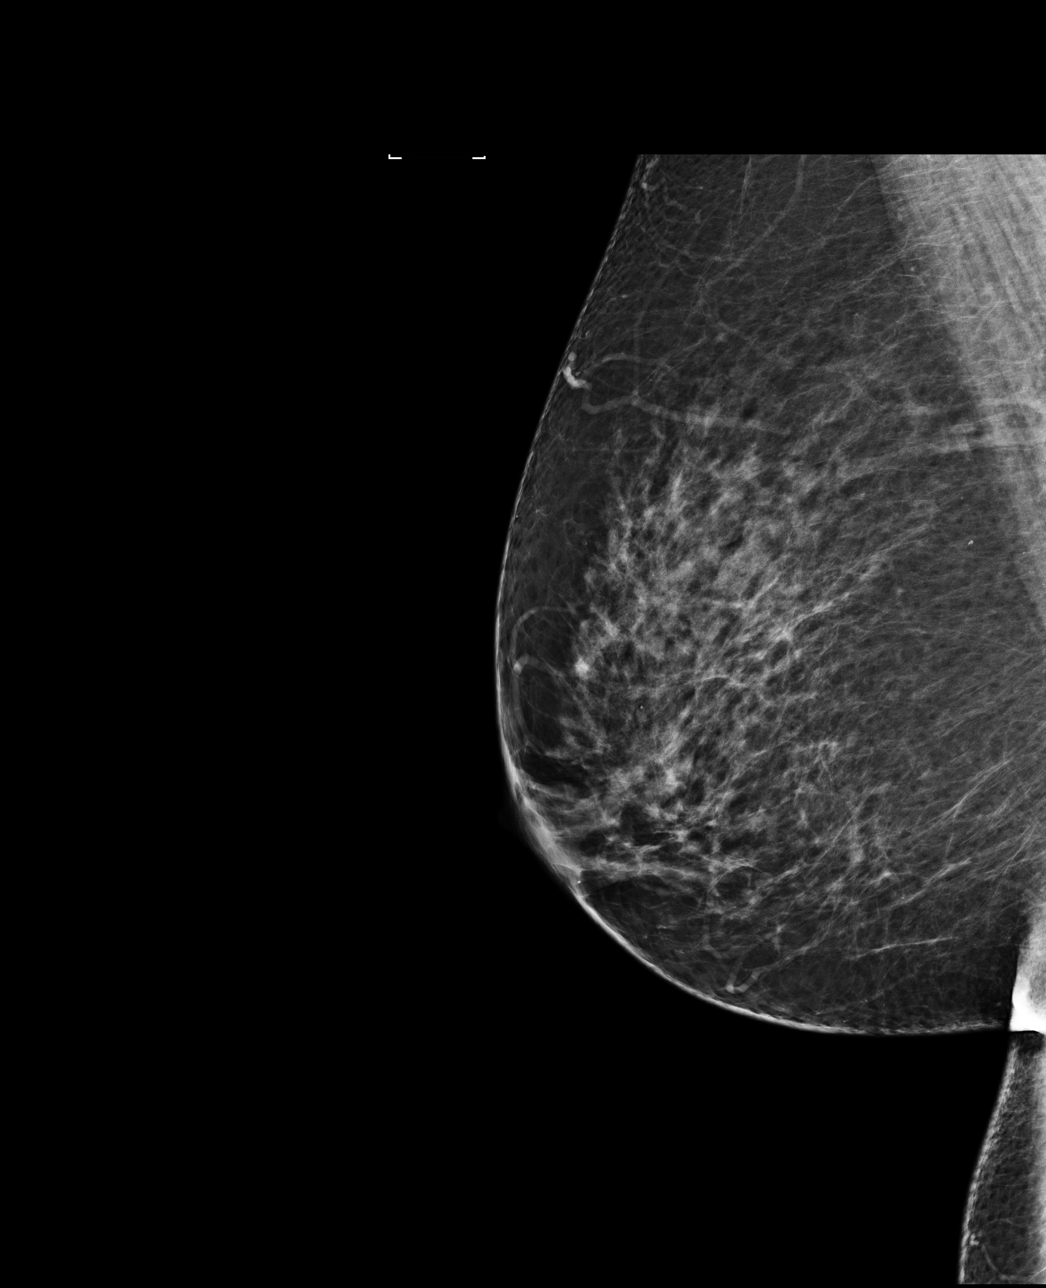

[R CC]
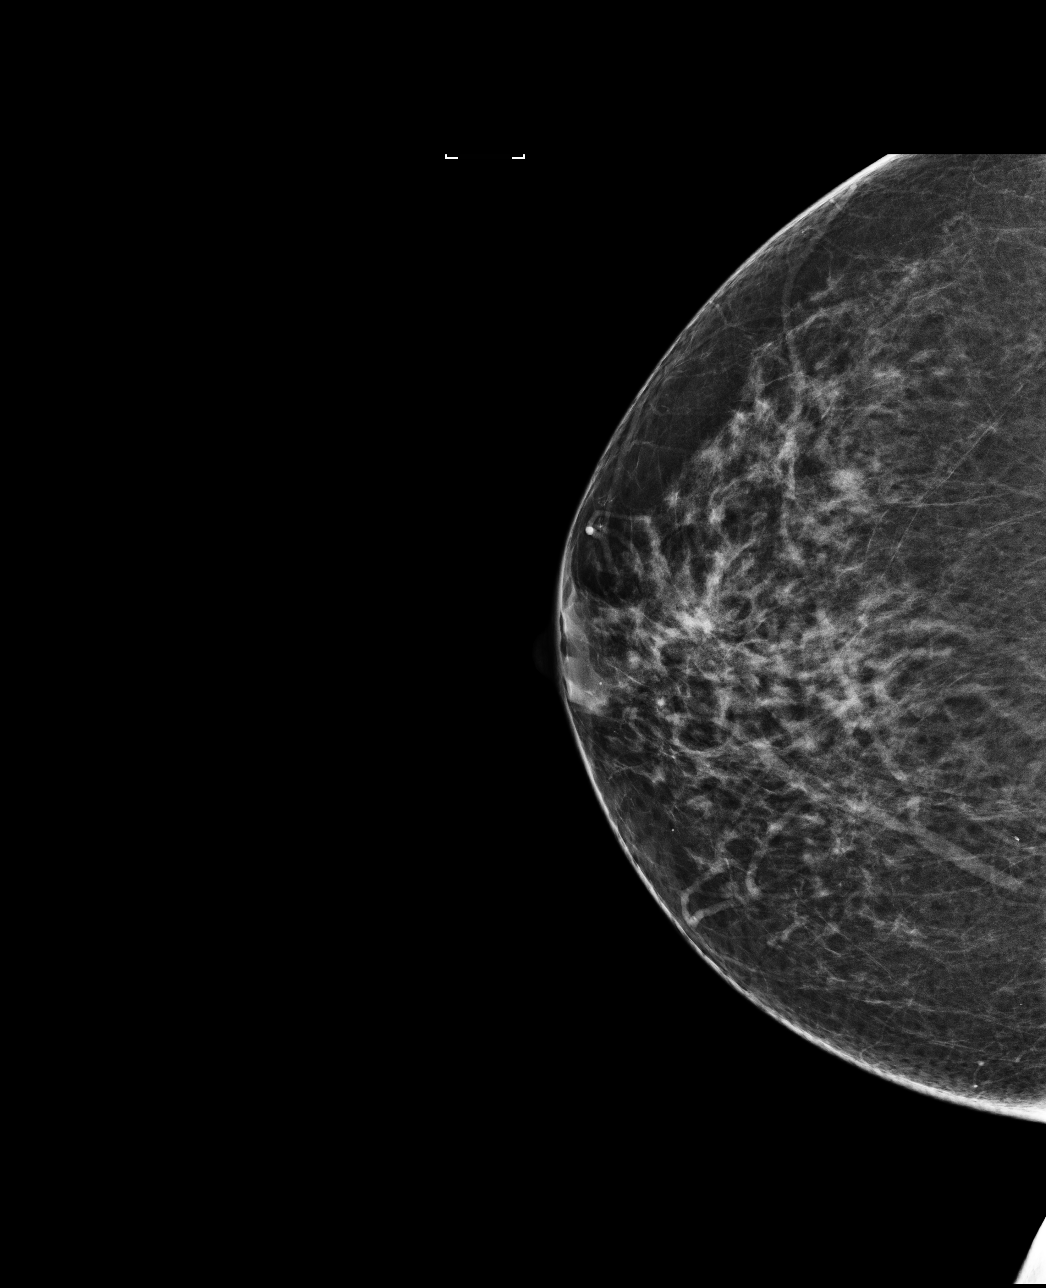

[4 of 4 positions shown; findings below may reference images not displayed]

ACR Breast Density Category b: There are scattered areas of
fibroglandular density.
FINDINGS: There are no findings suspicious for malignancy. Images were
processed with CAD.
IMPRESSION: No mammographic evidence of malignancy. A result letter of this
screening mammogram will be mailed directly to the patient.

RECOMMENDATION:
Screening mammogram in one year. (Code:AS-G-LCT)

BI-RADS CATEGORY  1: Negative.

## 2019-07-25 DIAGNOSIS — E559 Vitamin D deficiency, unspecified: Secondary | ICD-10-CM | POA: Insufficient documentation

## 2020-07-20 ENCOUNTER — Other Ambulatory Visit: Payer: Self-pay | Admitting: Physician Assistant

## 2021-01-18 ENCOUNTER — Other Ambulatory Visit: Payer: Self-pay | Admitting: Physician Assistant

## 2021-01-18 DIAGNOSIS — Z1231 Encounter for screening mammogram for malignant neoplasm of breast: Secondary | ICD-10-CM

## 2021-01-22 ENCOUNTER — Encounter: Payer: Self-pay | Admitting: Physician Assistant

## 2021-02-01 ENCOUNTER — Other Ambulatory Visit: Payer: Self-pay

## 2021-02-01 ENCOUNTER — Telehealth: Payer: Self-pay

## 2021-02-01 DIAGNOSIS — Z86010 Personal history of colonic polyps: Secondary | ICD-10-CM

## 2021-02-01 DIAGNOSIS — Z800 Family history of malignant neoplasm of digestive organs: Secondary | ICD-10-CM

## 2021-02-01 MED ORDER — CLENPIQ 10-3.5-12 MG-GM -GM/160ML PO SOLN: 1.0000 | Freq: Once | ORAL | 0 refills | Status: AC

## 2021-02-01 NOTE — Telephone Encounter (Signed)
CALLED PATIENT NO ANSWER LEFT VOICEMAIL FOR A CALL BACK ? ?

## 2021-02-01 NOTE — Progress Notes (Signed)
Gastroenterology Pre-Procedure Review  Request Date: 02/24/2021 Requesting Physician: Dr. Allen Norris  PATIENT REVIEW QUESTIONS: The patient responded to the following health history questions as indicated:    1. Are you having any GI issues? no 2. Do you have a personal history of Polyps? yes (10/2014 polyps removed.) 3. Do you have a family history of Colon Cancer or Polyps? yes (Mother- colon cancer) 4. Diabetes Mellitus? no 5. Joint replacements in the past 12 months?no 6. Major health problems in the past 3 months?no 7. Any artificial heart valves, MVP, or defibrillator?no    MEDICATIONS & ALLERGIES:    Patient reports the following regarding taking any anticoagulation/antiplatelet therapy:   Plavix, Coumadin, Eliquis, Xarelto, Lovenox, Pradaxa, Brilinta, or Effient? no Aspirin? yes (81 mg)  Patient confirms/reports the following medications:  Current Outpatient Medications  Medication Sig Dispense Refill   acetaminophen (TYLENOL) 500 MG tablet Take 1,500 mg by mouth daily as needed for moderate pain or headache.     aspirin EC 81 MG tablet Take 81 mg by mouth daily.     aspirin-acetaminophen-caffeine (EXCEDRIN MIGRAINE) 250-250-65 MG tablet Take 3 tablets by mouth 2 (two) times daily as needed for headache.     atenolol (TENORMIN) 50 MG tablet Take 50 mg by mouth every morning.      HYDROcodone-acetaminophen (NORCO) 5-325 MG tablet Take 1 tablet by mouth every 4 (four) hours as needed for moderate pain. (Patient not taking: Reported on 09/05/2016) 6 tablet 0   ibuprofen (ADVIL,MOTRIN) 200 MG tablet Take 600 mg by mouth 2 (two) times daily as needed (knee pain).     levothyroxine (SYNTHROID, LEVOTHROID) 50 MCG tablet Take 50 mcg by mouth at bedtime.      naproxen sodium (ANAPROX) 220 MG tablet Take 220 mg by mouth daily as needed (knee pain).     promethazine (PHENERGAN) 25 MG suppository Place 1 suppository (25 mg total) rectally every 6 (six) hours as needed for nausea. (Patient not  taking: Reported on 09/05/2016) 12 suppository 1   tamsulosin (FLOMAX) 0.4 MG CAPS capsule Take 1 capsule (0.4 mg total) by mouth daily after supper. 10 capsule 0   No current facility-administered medications for this visit.    Patient confirms/reports the following allergies:  Allergies  Allergen Reactions   Tape Rash   Cortisone Rash    Had a rash that lasted 3 weeks. Was given with marcaine and xylocaine. Not sure which of the three she had a reaction to.   Ethyl Chloride Rash    Gets a long acting rash from spray that last up to a month   Lidocaine Hcl Rash    No orders of the defined types were placed in this encounter.   AUTHORIZATION INFORMATION Primary Insurance: 1D#: Group #:  Secondary Insurance: 1D#: Group #:  SCHEDULE INFORMATION: Date: 02/24/2021 Time: Location: [place]

## 2021-02-24 ENCOUNTER — Ambulatory Visit: Payer: BC Managed Care – PPO | Admitting: Certified Registered"

## 2021-02-24 ENCOUNTER — Encounter: Payer: Self-pay | Admitting: Gastroenterology

## 2021-02-24 ENCOUNTER — Encounter
Admission: RE | Disposition: A | Discharge status: Home / Self Care | Payer: Self-pay | Source: Home / Self Care | Attending: Gastroenterology

## 2021-02-24 ENCOUNTER — Ambulatory Visit
Admission: RE | Admit: 2021-02-24 | Discharge: 2021-02-24 | Disposition: A | Discharge status: Home / Self Care | Payer: BC Managed Care – PPO | Attending: Gastroenterology | Admitting: Gastroenterology

## 2021-02-24 DIAGNOSIS — Z6841 Body Mass Index (BMI) 40.0 and over, adult: Secondary | ICD-10-CM | POA: Insufficient documentation

## 2021-02-24 DIAGNOSIS — Z1211 Encounter for screening for malignant neoplasm of colon: Secondary | ICD-10-CM | POA: Insufficient documentation

## 2021-02-24 DIAGNOSIS — G43909 Migraine, unspecified, not intractable, without status migrainosus: Secondary | ICD-10-CM | POA: Insufficient documentation

## 2021-02-24 DIAGNOSIS — E039 Hypothyroidism, unspecified: Secondary | ICD-10-CM | POA: Diagnosis not present

## 2021-02-24 DIAGNOSIS — Z860100 Personal history of colon polyps, unspecified: Secondary | ICD-10-CM

## 2021-02-24 DIAGNOSIS — K648 Other hemorrhoids: Secondary | ICD-10-CM | POA: Diagnosis not present

## 2021-02-24 DIAGNOSIS — G4730 Sleep apnea, unspecified: Secondary | ICD-10-CM | POA: Diagnosis not present

## 2021-02-24 DIAGNOSIS — I10 Essential (primary) hypertension: Secondary | ICD-10-CM | POA: Diagnosis not present

## 2021-02-24 DIAGNOSIS — R011 Cardiac murmur, unspecified: Secondary | ICD-10-CM | POA: Insufficient documentation

## 2021-02-24 DIAGNOSIS — G4489 Other headache syndrome: Secondary | ICD-10-CM | POA: Insufficient documentation

## 2021-02-24 DIAGNOSIS — Z800 Family history of malignant neoplasm of digestive organs: Secondary | ICD-10-CM

## 2021-02-24 DIAGNOSIS — D125 Benign neoplasm of sigmoid colon: Secondary | ICD-10-CM | POA: Insufficient documentation

## 2021-02-24 DIAGNOSIS — K635 Polyp of colon: Secondary | ICD-10-CM

## 2021-02-24 HISTORY — PX: COLONOSCOPY WITH PROPOFOL: SHX5780

## 2021-02-24 SURGERY — COLONOSCOPY WITH PROPOFOL
Anesthesia: General

## 2021-02-24 MED ORDER — SODIUM CHLORIDE 0.9 % IV SOLN: INTRAVENOUS | Status: DC

## 2021-02-24 MED ORDER — PROPOFOL 10 MG/ML IV BOLUS
INTRAVENOUS | Status: DC | PRN
  Administered 2021-02-24: 100 mg via INTRAVENOUS
  Administered 2021-02-24: 40 mg via INTRAVENOUS

## 2021-02-24 MED ORDER — PROPOFOL 500 MG/50ML IV EMUL
INTRAVENOUS | Status: DC | PRN
  Administered 2021-02-24: 150 ug/kg/min via INTRAVENOUS

## 2021-02-24 MED ORDER — LIDOCAINE HCL (CARDIAC) PF 100 MG/5ML IV SOSY
PREFILLED_SYRINGE | INTRAVENOUS | Status: DC | PRN
  Administered 2021-02-24: 80 mg via INTRAVENOUS

## 2021-02-24 NOTE — Transfer of Care (Signed)
Immediate Anesthesia Transfer of Care Note  Patient: Theresa Thomas  Procedure(s) Performed: COLONOSCOPY WITH PROPOFOL  Patient Location: PACU and Endoscopy Unit  Anesthesia Type:General  Level of Consciousness: drowsy  Airway & Oxygen Therapy: Patient Spontanous Breathing  Post-op Assessment: Report given to RN  Post vital signs: stable  Last Vitals:  Vitals Value Taken Time  BP    Temp    Pulse    Resp    SpO2      Last Pain:  Vitals:   02/24/21 0901  TempSrc: Temporal  PainSc: 0-No pain         Complications: No notable events documented.

## 2021-02-24 NOTE — Anesthesia Preprocedure Evaluation (Addendum)
Anesthesia Evaluation  Patient identified by MRN, date of birth, ID band Patient awake    Reviewed: Allergy & Precautions, H&P , NPO status , Patient's Chart, lab work & pertinent test results, reviewed documented beta blocker date and time   History of Anesthesia Complications Negative for: history of anesthetic complications  Airway Mallampati: II  TM Distance: >3 FB Neck ROM: full    Dental no notable dental hx. (+) Caps   Pulmonary neg shortness of breath, sleep apnea and Continuous Positive Airway Pressure Ventilation , neg COPD, neg recent URI, Patient abstained from smoking.Not current smoker,    Pulmonary exam normal breath sounds clear to auscultation       Cardiovascular Exercise Tolerance: Good METShypertension, (-) angina(-) CAD, (-) Past MI, (-) Cardiac Stents and (-) CABG Normal cardiovascular exam(-) dysrhythmias (-) Valvular Problems/Murmurs Rhythm:regular Rate:Normal - Systolic murmurs    Neuro/Psych  Headaches, neg Seizures negative psych ROS   GI/Hepatic negative GI ROS, Neg liver ROS, neg GERD  ,  Endo/Other  neg diabetesHypothyroidism Morbid obesity  Renal/GU negative Renal ROS  negative genitourinary   Musculoskeletal   Abdominal (+) + obese,   Peds  Hematology negative hematology ROS (+)   Anesthesia Other Findings Past Medical History: No date: Headache     Comment: MIGRAINES No date: Hypertension No date: Hypothyroidism No date: Sleep apnea     Comment: CPAP 2014: Snake bite poisoning     Comment: COPPERHEAD    Reproductive/Obstetrics negative OB ROS                            Anesthesia Physical  Anesthesia Plan  ASA: 3  Anesthesia Plan: General   Post-op Pain Management: Minimal or no pain anticipated   Induction: Intravenous  PONV Risk Score and Plan: 3 and Propofol infusion, TIVA and Ondansetron  Airway Management Planned: Nasal Cannula and  Natural Airway  Additional Equipment: None  Intra-op Plan:   Post-operative Plan:   Informed Consent: I have reviewed the patients History and Physical, chart, labs and discussed the procedure including the risks, benefits and alternatives for the proposed anesthesia with the patient or authorized representative who has indicated his/her understanding and acceptance.     Dental Advisory Given  Plan Discussed with: Anesthesiologist, CRNA and Surgeon  Anesthesia Plan Comments: (Discussed risks of anesthesia with patient, including possibility of difficulty with spontaneous ventilation under anesthesia necessitating airway intervention, PONV, and rare risks such as cardiac or respiratory or neurological events, and allergic reactions. Discussed the role of CRNA in patient's perioperative care. Patient understands. Patient informed about increased incidence of above perioperative risk due to high BMI. Patient understands.  Patient has listed allergy to lidocaine; patient says she received a knee injection once and developed a rash over her body. She has received local anesthetics for dental work before without issue. I think IV lidocaine should be fine for her, seems as if her reaction may have been to whatever cocktail they injected in her knee. )       Anesthesia Quick Evaluation

## 2021-02-24 NOTE — H&P (Signed)
Theresa Lame, MD [address] Theresa Thomas, [place] [address] Phone:[phone] Fax : [phone]  Primary Care Physician:  Juluis Pitch, MD Primary Gastroenterologist:  Dr. Allen Norris  Pre-Procedure History & Physical: HPI:  Theresa Thomas is a 61 y.o. female is here for an colonoscopy.   Past Medical History:  Diagnosis Date   Headache    MIGRAINES   Hypertension    Hypothyroidism    Sleep apnea    CPAP   Snake bite poisoning 2014   [place]     Past Surgical History:  Procedure Laterality Date   COLONOSCOPY WITH PROPOFOL N/A 10/26/2014   Procedure: COLONOSCOPY WITH PROPOFOL;  Surgeon: Manya Silvas, MD;  Location: [facility];  Service: Endoscopy;  Laterality: N/A;   KNEE ARTHROSCOPY Left 01/20/2016   Procedure: ARTHROSCOPY KNEE, PARTIAL MEDIAL MENISCECTOMY, CHONDROPLASTY;  Surgeon: Leanor Kail, MD;  Location: [facility] ORS;  Service: Orthopedics;  Laterality: Left;   REPLACEMENT TOTAL KNEE Left 2019    Prior to Admission medications   Medication Sig Start Date End Date Taking? Authorizing Provider  aspirin EC 81 MG tablet Take 81 mg by mouth daily.   Yes [provider]  atenolol (TENORMIN) 50 MG tablet Take 50 mg by mouth every morning.    Yes [provider]  atorvastatin (LIPITOR) 10 MG tablet Take 10 mg by mouth daily.   Yes [provider]  furosemide (LASIX) 20 MG tablet Take 20 mg by mouth.   Yes [provider]  levothyroxine (SYNTHROID, LEVOTHROID) 50 MCG tablet Take 50 mcg by mouth at bedtime.    Yes [provider]  acetaminophen (TYLENOL) 500 MG tablet Take 1,500 mg by mouth daily as needed for moderate pain or headache.    [provider]  aspirin-acetaminophen-caffeine (EXCEDRIN MIGRAINE) [phone] MG tablet Take 3 tablets by mouth 2 (two) times daily as needed for headache.    [provider]  HYDROcodone-acetaminophen (NORCO) 5-325 MG tablet Take 1 tablet by mouth every 4  (four) hours as needed for moderate pain. Patient not taking: Reported on 09/05/2016 08/16/16   Merlyn Lot, MD  ibuprofen (ADVIL,MOTRIN) 200 MG tablet Take 600 mg by mouth 2 (two) times daily as needed (knee pain).    [provider]  naproxen sodium (ANAPROX) 220 MG tablet Take 220 mg by mouth daily as needed (knee pain).    [provider]  promethazine (PHENERGAN) 25 MG suppository Place 1 suppository (25 mg total) rectally every 6 (six) hours as needed for nausea. Patient not taking: Reported on 09/05/2016 08/16/16 08/16/17  Merlyn Lot, MD  tamsulosin (FLOMAX) 0.4 MG CAPS capsule Take 1 capsule (0.4 mg total) by mouth daily after supper. Patient not taking: Reported on 02/24/2021 08/16/16   Merlyn Lot, MD    Allergies as of 02/01/2021 - Review Complete 09/05/2016  Allergen Reaction Noted   Tape Rash 02/08/2016   Cortisone Rash 09/07/2015   Ethyl chloride Rash 10/21/2015   Lidocaine hcl Rash 10/21/2015    Family History  Problem Relation Age of Onset   Breast cancer Mother 27   Kidney cancer Neg Hx    Bladder Cancer Neg Hx     Social History   Socioeconomic History   Marital status: Single    Spouse name: Not on file   Number of children: Not on file   Years of education: Not on file   Highest education level: Not on file  Occupational History   Not on file  Tobacco Use  Smoking status: Never   Smokeless tobacco: Never  Vaping Use   Vaping Use: Never used  Substance and Sexual Activity   Alcohol use: Yes    Alcohol/week: 2.0 standard drinks    Types: 2 Cans of beer per week    Comment: O[facility]   Drug use: No   Sexual activity: Not on file  Other Topics Concern   Not on file  Social History Narrative   Not on file   Social Determinants of Health   Financial Resource Strain: Not on file  Food Insecurity: Not on file  Transportation Needs: Not on file  Physical Activity: Not on file  Stress: Not on file  Social Connections: Not  on file  Intimate Partner Violence: Not on file    Review of Systems: See HPI, otherwise negative ROS  Physical Exam: BP (!) 157/90    Pulse 81    Temp (!) 97.2 F (36.2 C) (Temporal)    Resp 20    Ht 5' 7.5" (1.715 m)    Wt 122.5 kg    SpO2 99%    BMI 41.66 kg/m  General:   Alert,  pleasant and cooperative in NAD Head:  Normocephalic and atraumatic. Neck:  Supple; no masses or thyromegaly. Lungs:  Clear throughout to auscultation.    Heart:  Regular rate and rhythm. Abdomen:  Soft, nontender and nondistended. Normal bowel sounds, without guarding, and without rebound.   Neurologic:  Alert and  oriented x4;  grossly normal neurologically.  Impression/Plan: [name] is here for an colonoscopy to be performed for a history of adenomatous polyps on 2016    Risks, benefits, limitations, and alternatives regarding  colonoscopy have been reviewed with the patient.  Questions have been answered.  All parties agreeable.   Theresa Lame, MD  02/24/2021, 9:08 AM

## 2021-02-24 NOTE — Anesthesia Postprocedure Evaluation (Signed)
Anesthesia Post Note  Patient: Theresa Thomas  Procedure(s) Performed: COLONOSCOPY WITH PROPOFOL  Patient location during evaluation: Endoscopy Anesthesia Type: General Level of consciousness: awake and alert Pain management: pain level controlled Vital Signs Assessment: post-procedure vital signs reviewed and stable Respiratory status: spontaneous breathing, nonlabored ventilation, respiratory function stable and patient connected to nasal cannula oxygen Cardiovascular status: blood pressure returned to baseline and stable Postop Assessment: no apparent nausea or vomiting Anesthetic complications: no   No notable events documented.   Last Vitals:  Vitals:   02/24/21 0901 02/24/21 0946  BP: (!) 157/90 102/65  Pulse: 81 80  Resp: 20 18  Temp: (!) 36.2 C   SpO2: 99% 96%    Last Pain:  Vitals:   02/24/21 0956  TempSrc:   PainSc: 0-No pain                 Arita Miss

## 2021-02-24 NOTE — Op Note (Signed)
[facility] Gastroenterology Patient Name: Theresa Thomas Procedure Date: 02/24/2021 9:22 AM MRN: [id number] Account #: [id number] Date of Birth: Aug 12, 1960 Admit Type: Outpatient Age: 61 Room: [facility] ENDO ROOM 4 Gender: Female Note Status: Finalized Instrument Name: Jasper Riling [id number] Procedure:             Colonoscopy Indications:           High risk colon cancer surveillance: Personal history                         of colonic polyps Providers:             Lucilla Lame MD, MD Referring MD:          No Local Md, MD (Referring MD) Medicines:             Propofol per Anesthesia Complications:         No immediate complications. Procedure:             Pre-Anesthesia Assessment:                        - Prior to the procedure, a History and Physical was                         performed, and patient medications and allergies were                         reviewed. The patient's tolerance of previous                         anesthesia was also reviewed. The risks and benefits                         of the procedure and the sedation options and risks                         were discussed with the patient. All questions were                         answered, and informed consent was obtained. Prior                         Anticoagulants: The patient has taken no previous                         anticoagulant or antiplatelet agents. ASA Grade                         Assessment: II - A patient with mild systemic disease.                         After reviewing the risks and benefits, the patient                         was deemed in satisfactory condition to undergo the                         procedure.  After obtaining informed consent, the colonoscope was                         passed under direct vision. Throughout the procedure,                         the patient's blood pressure, pulse, and oxygen                         saturations were  monitored continuously. The                         Colonoscope was introduced through the anus and                         advanced to the the cecum, identified by appendiceal                         orifice and ileocecal valve. The colonoscopy was                         performed without difficulty. The patient tolerated                         the procedure well. The quality of the bowel                         preparation was excellent. Findings:      The perianal and digital rectal examinations were normal.      A 3 mm polyp was found in the sigmoid colon. The polyp was sessile. The       polyp was removed with a cold biopsy forceps. Resection and retrieval       were complete.      Non-bleeding internal hemorrhoids were found during retroflexion. The       hemorrhoids were Grade II (internal hemorrhoids that prolapse but reduce       spontaneously). Impression:            - One 3 mm polyp in the sigmoid colon, removed with a                         cold biopsy forceps. Resected and retrieved.                        - Non-bleeding internal hemorrhoids. Recommendation:        - Discharge patient to home.                        - Resume previous diet.                        - Continue present medications.                        - Await pathology results.                        - Repeat colonoscopy in 7 years for surveillance. Procedure Code(s):     --- Professional ---  45380, Colonoscopy, flexible; with biopsy, single or                         multiple Diagnosis Code(s):     --- Professional ---                        Z86.010, Personal history of colonic polyps                        K63.5, Polyp of colon CPT copyright 2019 American [employer] Association. All rights reserved. The codes documented in this report are preliminary and upon coder review may  be revised to meet current compliance requirements. Lucilla Lame MD, MD 02/24/2021 9:45:05 AM This report  has been signed electronically. Number of Addenda: 0 Note Initiated On: 02/24/2021 9:22 AM Scope Withdrawal Time: 0 hours 9 minutes 35 seconds  Total Procedure Duration: 0 hours 14 minutes 3 seconds  Estimated Blood Loss:  Estimated blood loss: none.      [facility]

## 2021-02-25 ENCOUNTER — Encounter: Payer: Self-pay | Admitting: Gastroenterology

## 2021-02-25 LAB — SURGICAL PATHOLOGY

## 2021-02-28 ENCOUNTER — Encounter: Payer: Self-pay | Admitting: Gastroenterology

## 2021-03-23 ENCOUNTER — Ambulatory Visit
Admission: RE | Admit: 2021-03-23 | Discharge: 2021-03-23 | Disposition: A | Discharge status: Home / Self Care | Payer: BC Managed Care – PPO | Source: Ambulatory Visit | Attending: Physician Assistant | Admitting: Physician Assistant

## 2021-03-23 ENCOUNTER — Other Ambulatory Visit: Payer: Self-pay

## 2021-03-23 DIAGNOSIS — Z1231 Encounter for screening mammogram for malignant neoplasm of breast: Secondary | ICD-10-CM | POA: Insufficient documentation

## 2021-04-29 ENCOUNTER — Other Ambulatory Visit: Payer: Self-pay | Admitting: Family Medicine

## 2021-04-29 ENCOUNTER — Other Ambulatory Visit (HOSPITAL_COMMUNITY): Payer: Self-pay | Admitting: Family Medicine

## 2021-04-29 DIAGNOSIS — R748 Abnormal levels of other serum enzymes: Secondary | ICD-10-CM

## 2021-05-03 ENCOUNTER — Ambulatory Visit
Admission: RE | Admit: 2021-05-03 | Discharge: 2021-05-03 | Disposition: A | Discharge status: Home / Self Care | Payer: BC Managed Care – PPO | Source: Ambulatory Visit | Attending: Family Medicine | Admitting: Family Medicine

## 2021-05-03 ENCOUNTER — Other Ambulatory Visit: Payer: Self-pay

## 2021-05-03 DIAGNOSIS — R748 Abnormal levels of other serum enzymes: Secondary | ICD-10-CM | POA: Insufficient documentation

## 2021-05-15 NOTE — Discharge Instructions (Signed)
Instructions after Total Knee Replacement   Theresa Thomas, Jr., M.D.     Dept. of Orthopaedics & Sports Medicine  [facility]  [address]  [place], [place]  [address]  Phone: [phone]   Fax: [phone]    DIET: Drink plenty of non-alcoholic fluids. Resume your normal diet. Include foods high in fiber.  ACTIVITY:  You may use crutches or a walker with weight-bearing as tolerated, unless instructed otherwise. You may be weaned off of the walker or crutches by your Physical Therapist.  Do NOT place pillows under the knee. Anything placed under the knee could limit your ability to straighten the knee.   Continue doing gentle exercises. Exercising will reduce the pain and swelling, increase motion, and prevent muscle weakness.   Please continue to use the TED compression stockings for 6 weeks. You may remove the stockings at night, but should reapply them in the morning. Do not drive or operate any equipment until instructed.  WOUND CARE:  Continue to use the PolarCare or ice packs periodically to reduce pain and swelling. You may bathe or shower after the staples are removed at the first office visit following surgery.  MEDICATIONS: You may resume your regular medications. Please take the pain medication as prescribed on the medication. Do not take pain medication on an empty stomach. You have been given a prescription for a blood thinner (Lovenox or Coumadin). Please take the medication as instructed. (NOTE: After completing a 2 week course of Lovenox, take one Enteric-coated aspirin once a day. This along with elevation will help reduce the possibility of phlebitis in your operated leg.) Do not drive or drink alcoholic beverages when taking pain medications.  CALL THE OFFICE FOR: Temperature above 101 degrees Excessive bleeding or drainage on the dressing. Excessive swelling, coldness, or paleness of the toes. Persistent nausea and vomiting.  FOLLOW-UP:  You  should have an appointment to return to the office in 10-14 days after surgery. Arrangements have been made for continuation of Physical Therapy (either home therapy or outpatient therapy).   [facility] Department Directory         [email or web address]       [email or web address]          Cardiology  Appointments: [place] - [phone] [place] - [phone]  Endocrinology  Appointments: [place] - [phone] [place] - [phone]  Gastroenterology  Appointments: [place] - [phone] [place] - [phone]        General Surgery   Appointments: [place] - [phone]  Internal Medicine/Family Medicine  Appointments: [place] - [phone] [place] - [phone] [place] - [phone]  Metabolic and Weigh Loss Surgery  Appointments: [place] - [phone]        Neurology  Appointments: [place] - [phone] [place] - [phone]  Neurosurgery  Appointments: [place] - [phone]  Obstetrics & Gynecology  Appointments: [place] - [phone] [place] - [phone]        Pediatrics  Appointments: [place] - [phone] [place] - [phone]  Physiatry  Appointments: [place] -[phone]  Physical Therapy  Appointments: [place] - [phone] [place] - [phone]        Podiatry  Appointments: [place] - [phone] [place] - [phone]  Pulmonology  Appointments: [place] - [phone]  Rheumatology  Appointments: [place] - [phone]        [place] Location: [facility]  [address] [place], [place]  [address]  [place] Location: [facility] [address] [place], [place]  [address]  [place] Location: [facility] [address] [place], [place]  [address]    

## 2021-05-20 ENCOUNTER — Encounter
Admission: RE | Admit: 2021-05-20 | Discharge: 2021-05-20 | Disposition: A | Discharge status: Home / Self Care | Payer: BC Managed Care – PPO | Source: Ambulatory Visit | Attending: Orthopedic Surgery | Admitting: Orthopedic Surgery

## 2021-05-20 VITALS — BP 111/72 | HR 65 | Resp 18 | Ht 68.0 in | Wt 260.6 lb

## 2021-05-20 DIAGNOSIS — Z86010 Personal history of colonic polyps: Secondary | ICD-10-CM | POA: Insufficient documentation

## 2021-05-20 DIAGNOSIS — M1711 Unilateral primary osteoarthritis, right knee: Secondary | ICD-10-CM | POA: Diagnosis not present

## 2021-05-20 DIAGNOSIS — N200 Calculus of kidney: Secondary | ICD-10-CM

## 2021-05-20 DIAGNOSIS — Z01818 Encounter for other preprocedural examination: Secondary | ICD-10-CM | POA: Insufficient documentation

## 2021-05-20 DIAGNOSIS — Z01810 Encounter for preprocedural cardiovascular examination: Secondary | ICD-10-CM | POA: Diagnosis not present

## 2021-05-20 DIAGNOSIS — I493 Ventricular premature depolarization: Secondary | ICD-10-CM | POA: Diagnosis not present

## 2021-05-20 DIAGNOSIS — Z01812 Encounter for preprocedural laboratory examination: Secondary | ICD-10-CM

## 2021-05-20 HISTORY — DX: Personal history of urinary calculi: Z87.442

## 2021-05-20 HISTORY — DX: Unspecified osteoarthritis, unspecified site: M19.90

## 2021-05-20 HISTORY — DX: Family history of other specified conditions: Z84.89

## 2021-05-20 LAB — URINALYSIS, ROUTINE W REFLEX MICROSCOPIC
Bilirubin Urine: NEGATIVE
Glucose, UA: NEGATIVE mg/dL
Hgb urine dipstick: NEGATIVE
Ketones, ur: NEGATIVE mg/dL
Leukocytes,Ua: NEGATIVE
Nitrite: NEGATIVE
Protein, ur: NEGATIVE mg/dL
Specific Gravity, Urine: 1.014 (ref 1.005–1.030)
pH: 5 (ref 5.0–8.0)

## 2021-05-20 LAB — COMPREHENSIVE METABOLIC PANEL
ALT: 60 U/L — ABNORMAL HIGH (ref 0–44)
AST: 60 U/L — ABNORMAL HIGH (ref 15–41)
Albumin: 4.6 g/dL (ref 3.5–5.0)
Alkaline Phosphatase: 87 U/L (ref 38–126)
Anion gap: 10 (ref 5–15)
BUN: 23 mg/dL — ABNORMAL HIGH (ref 6–20)
CO2: 28 mmol/L (ref 22–32)
Calcium: 9.5 mg/dL (ref 8.9–10.3)
Chloride: 102 mmol/L (ref 98–111)
Creatinine, Ser: 0.89 mg/dL (ref 0.44–1.00)
GFR, Estimated: >60 mL/min (ref >60)
Glucose, Bld: 85 mg/dL (ref 70–99)
Potassium: 3.4 mmol/L — ABNORMAL LOW (ref 3.5–5.1)
Sodium: 140 mmol/L (ref 135–145)
Total Bilirubin: 1 mg/dL (ref 0.3–1.2)
Total Protein: 8.3 g/dL — ABNORMAL HIGH (ref 6.5–8.1)

## 2021-05-20 LAB — TYPE AND SCREEN
ABO/RH(D): O POS
Antibody Screen: NEGATIVE

## 2021-05-20 LAB — CBC
HCT: 40 % (ref 36.0–46.0)
Hemoglobin: 13.7 g/dL (ref 12.0–15.0)
MCH: 28.1 pg (ref 26.0–34.0)
MCHC: 34.3 g/dL (ref 30.0–36.0)
MCV: 82.1 fL (ref 80.0–100.0)
Platelets: 268 10*3/uL (ref 150–400)
RBC: 4.87 MIL/uL (ref 3.87–5.11)
RDW: 11.9 % (ref 11.5–15.5)
WBC: 4.5 10*3/uL (ref 4.0–10.5)
nRBC: 0 % (ref 0.0–0.2)

## 2021-05-20 LAB — C-REACTIVE PROTEIN: CRP: 0.7 mg/dL (ref <1.0)

## 2021-05-20 LAB — SURGICAL PCR SCREEN
MRSA, PCR: NEGATIVE
Staphylococcus aureus: NEGATIVE

## 2021-05-20 LAB — SEDIMENTATION RATE: Sed Rate: 10 mm/hr (ref 0–22)

## 2021-05-20 NOTE — Patient Instructions (Addendum)
Your procedure is scheduled on: 06/01/21 - Wednesday ?Report to the Registration Desk on the 1st floor of the [facility]. ?To find out your arrival time, please call [phone] between 1PM - 3PM on: 05/31/21 - Tuesday ? ?REMEMBER: ?Instructions that are not followed completely may result in serious medical risk, up to and including death; or upon the discretion of your surgeon and anesthesiologist your surgery may need to be rescheduled. ? ?Do not eat food after midnight the night before surgery.  ?No gum chewing, lozengers or hard candies. ? ?You may however, drink CLEAR liquids up to 2 hours before you are scheduled to arrive for your surgery. Do not drink anything within 2 hours of your scheduled arrival time. ? ?Clear liquids include: ?- water  ?- apple juice without pulp ?- gatorade (not RED colors) ?- black coffee or tea (Do NOT add milk or creamers to the coffee or tea) ?Do NOT drink anything that is not on this list. ? ?In addition, your doctor has ordered for you to drink the provided  ?Ensure Pre-Surgery Clear Carbohydrate Drink  ?Drinking this carbohydrate drink up to two hours before surgery helps to reduce insulin resistance and improve patient outcomes. Please complete drinking 2 hours prior to scheduled arrival time. ? ?TAKE ONLY THESE MEDICATIONS THE MORNING OF SURGERY WITH A SIP OF WATER: ? ?- atenolol (TENORMIN) 50 MG tablet ?- fenofibrate (TRICOR) 145 MG tablet ?- levothyroxine (SYNTHROID) 100 MCG tablet ? ?Follow recommendations from Cardiologist, Pulmonologist or PCP regarding stopping Aspirin, Coumadin, Plavix, Eliquis, Pradaxa, or Pletal. Continue Aspirin but do not take the day of surgery. ? ?Stop taking beginning 05/24/21 , One week prior to surgery: ?Stop Anti-inflammatories (NSAIDS) such as Advil, Aleve, Ibuprofen, Motrin, Naproxen, Naprosyn and Aspirin based products such as Excedrin, Goodys Powder, BC Powder. ? ?Stop taking beginning 05/24/21,  ANY OVER THE COUNTER supplements  until after surgery. CBD sleep . ? ?You may however, continue to take Tylenol if needed for pain up until the day of surgery. ? ?No Alcohol for 24 hours before or after surgery. ? ?No Smoking including e-cigarettes for 24 hours prior to surgery.  ?No chewable tobacco products for at least 6 hours prior to surgery.  ?No nicotine patches on the day of surgery. ? ?Do not use any "recreational" drugs for at least a week prior to your surgery.  ?Please be advised that the combination of cocaine and anesthesia may have negative outcomes, up to and including death. ?If you test positive for cocaine, your surgery will be cancelled. ? ?On the morning of surgery brush your teeth with toothpaste and water, you may rinse your mouth with mouthwash if you wish. ?Do not swallow any toothpaste or mouthwash. ? ?Use CHG Soap or wipes as directed on instruction sheet. ? ?Do not wear jewelry, make-up, hairpins, clips or nail polish. ? ?Do not wear lotions, powders, or perfumes.  ? ?Do not shave body from the neck down 48 hours prior to surgery just in case you cut yourself which could leave a site for infection.  ?Also, freshly shaved skin may become irritated if using the CHG soap. ? ?Contact lenses, hearing aids and dentures may not be worn into surgery. ? ?Do not bring valuables to the hospital. [facility] is not responsible for any missing/lost belongings or valuables.  ? ?Bring your C-PAP to the hospital with you in case you may have to spend the night.  ? ?Notify your doctor if there is any change in your  medical condition (cold, fever, infection). ? ?Wear comfortable clothing (specific to your surgery type) to the hospital. ? ?After surgery, you can help prevent lung complications by doing breathing exercises.  ?Take deep breaths and cough every 1-2 hours. Your doctor may order a device called an Incentive Spirometer to help you take deep breaths. ?When coughing or sneezing, hold a pillow firmly against your incision with both  hands. This is called ?splinting.? Doing this helps protect your incision. It also decreases belly discomfort. ? ?If you are being admitted to the hospital overnight, leave your suitcase in the car. ?After surgery it may be brought to your room. ? ?If you are being discharged the day of surgery, you will not be allowed to drive home. ?You will need a responsible adult (18 years or older) to drive you home and stay with you that night.  ? ?If you are taking public transportation, you will need to have a responsible adult (18 years or older) with you. ?Please confirm with your physician that it is acceptable to use public transportation.  ? ?Please call the [facility] Dept. at [phone] if you have any questions about these instructions. ? ?Surgery Visitation Policy: ? ?Patients undergoing a surgery or procedure may have two family members or support persons with them as long as the person is not COVID-19 positive or experiencing its symptoms.  ? ?Inpatient Visitation:   ? ?Visiting hours are 7 a.m. to 8 p.m. ?Up to four visitors are allowed at one time in a patient room, including children. The visitors may rotate out with other people during the day. One designated support person (adult) may remain overnight.  ?

## 2021-05-28 NOTE — H&P (Signed)
ORTHOPAEDIC HISTORY & PHYSICAL ?[facility] [place], [place] - 05/24/2021 8:15 AM EDT ?Formatting of this note is different from the original. ?[facility] ?ORTHOPAEDICS [facility]TS [facility]ICINE ?Chief Complaint:  ? ?Chief Complaint  ?Patient presents with  ? Knee Pain  ?H & P RIGHT KNEE  ? ?History of Present Illness:  ? ?Theresa Thomas is a 61 y.o. female that presents to clinic today for her preoperative history and evaluation. Patient presents unaccompanied. The patient is scheduled to undergo a right total knee arthroplasty on 06/01/21 by Dr. Marry Guan. Her pain began several years ago. The pain is located primarily along the medial aspect of the knee. She describes her pain as worse with weightbearing and stairs. She reports associated swelling with some giving way of the knee. She denies associated numbness or tingling, denies locking.  ? ?The patient's symptoms have progressed to the point that they decrease her quality of life. The patient has previously undergone conservative treatment including NSAIDS and activity modification without adequate control of her symptoms. ? ?Denies history of DVT, lumbar surgery, or significant cardiac history.  ? ?Patient's mother will be staying with her after surgery. She also has her sister and niece nearby.  ? ?Patient does report previous head-to-toe rash when receiving cortisone injections.  ? ?Past Medical, Surgical, Family, Social History, Allergies, Medications:  ? ?Past Medical History:  ?Past Medical History:  ?Diagnosis Date  ? Arthritis  ? Benign essential HTN 01/18/2016  ? Fatty liver  ? 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 Hemorrhoids  ? Hyperlipidemia  ? Hypertension  ? Kidney stone  ? Liver disease  ? Migraine  ? Nephrolithiasis 09/05/2016  ? Obesity (BMI 30-39.9), unspecified 06/06/2016  ? Osteoarthritis  ? Sleep apnea  ? Snake bite 2014  ?treated with anti venom LE/ankle Edema Left  ? Thyroid disease  ? ?Past Surgical History:  ?Past Surgical History:  ?Procedure  Laterality Date  ? COLONOSCOPY 12/31/2006  ?Dr. Ivor Messier @ [place], [facility](m)  ? COLONOSCOPY 10/26/2014  ?Dr. Kennis Carina @ [place] [facility] - Adenomatous Polyps, [facility](m), [facility] 10/2019  ? KNEE ARTHROSCOPY Left 01/20/2016  ? Left total knee robotic-assisted Makoplasty 12/31/2017  ?Dr Caswell Corwin  ? ?Current Medications:  ?Current Outpatient Medications  ?Medication Sig Dispense Refill  ? acetaminophen (TYLENOL) 500 MG tablet Take 1,000 mg by mouth every 6 (six) hours as needed  ? amoxicillin (AMOXIL) 500 MG capsule 2,000 mg 4 CAPS 1 HOUR PRIOR TO DENTAL APPOINTMENT  ? aspirin 81 MG EC tablet Take 81 mg by mouth 2 (two) times daily  ? atenolol (TENORMIN) 50 MG tablet Take 25 mg by mouth once daily Patient is taking 25 mg  ? atorvastatin (LIPITOR) 20 MG tablet Take 20 mg by mouth at bedtime  ? cetirizine (ZYRTEC) 10 MG tablet Take 10 mg by mouth once daily as needed  ? diphenhydrAMINE (BENADRYL) 25 mg tablet Take 25 mg by mouth at bedtime as needed  ? fenofibrate nanocrystallized (TRICOR) 145 MG tablet TAKE 1 TABLET BY MOUTH DAILY FOR HIGH TRIGLYCERIDES  ? FUROsemide (LASIX) 20 MG tablet Take 20 mg by mouth once daily  ? levothyroxine (SYNTHROID) 100 MCG tablet TAKE 1 TABLET BY MOUTH EVERY DAY 30 tablet 2  ? ?No current facility-administered medications for this visit.  ? ?Allergies:  ?Allergies  ?Allergen Reactions  ? Adhesive Rash  ? Cortisone Rash  ?Had a rash that lasted 3 weeks. Was given with marcaine and xylocaine. Not sure which of the three she had a  reaction to.  ? Ethyl Chloride Rash  ?Gets a long acting rash from spray that last up to a month  ? Xylocaine [Lidocaine Hcl] Rash  ? ?Social History:  ?Social History  ? ?Socioeconomic History  ? Marital status: Single  ? Number of children: 0  ?Occupational History  ?Comment: Retired  ?Tobacco Use  ? Smoking status: Never  ? Smokeless tobacco: Never  ?Vaping Use  ? Vaping Use: Never used  ?Substance and Sexual Activity  ? Alcohol use: Yes  ?Alcohol/week: 1.0 - 2.0 standard  drink  ?Types: 1 - 2 Standard drinks or equivalent per week  ? Drug use: Never  ? Sexual activity: Not Currently  ?Partners: Male  ?Social History Narrative  ?Pt does recumbent bike 6 times a week for 30 minutes. No fast food rarely eats red meat.  ? ?Family History:  ?Family History  ?Problem Relation Age of Onset  ? High blood pressure (Hypertension) Mother  ? Colon cancer Mother 42  ? Breast cancer Mother  ? Atrial fibrillation (Abnormal heart rhythm sometimes requiring treatment with blood thinners) Mother  ? Stroke Father  ? Prostate cancer Father  ? Heart disease Father  ? No Known Problems Sister  ? High blood pressure (Hypertension) Brother  ? Hyperlipidemia (Elevated cholesterol) Brother  ? Kidney disease Brother  ? Lung cancer Maternal Grandmother  ? Lung cancer Maternal Grandfather  ? Heart disease Paternal Grandmother  ? Breast cancer Paternal Grandmother  ? ?Review of Systems:  ? ?A 10+ ROS was performed, reviewed, and the pertinent orthopaedic findings are documented in the HPI.  ? ?Physical Examination:  ? ?BP (!) 140/80 (BP Location: Left upper arm, Patient Position: Sitting, BP Cuff Size: Large Adult)  Ht 171.5 cm (5' 7.5")  Wt (!) 119.9 kg (264 lb 6.4 oz)  BMI 40.80 kg/m?  ? ?Patient is a well-developed, well-nourished female in no acute distress. Patient has normal mood and affect. Patient is alert and oriented to person, place, and time.  ? ?HEENT: Atraumatic, normocephalic. Pupils equal and reactive to light. Extraocular motion intact. Noninjected sclera. ? ?Cardiovascular: Regular rate and rhythm, with no murmurs, rubs, or gallops. Pulses auscultated with Doppler. No carotid bruits. ? ?Respiratory: Lungs clear to auscultation bilaterally.  ? ?Right Knee: ?Soft tissue swelling: mild ?Effusion: none ?Erythema: none ?Crepitance: minimal ?Tenderness: medial ?Alignment: relative varus ?Mediolateral laxity: medial pseudolaxity ?Posterior sag: negative ?Patellar tracking: Good tracking without  evidence of subluxation or tilt ?Atrophy: No significant atrophy.  ?Quadriceps tone was fair to good. ?Range of motion: 0/0/106 degrees  ? ?Able to actively dorsiflex and plantarflex the right ankle. Able to flex and extend the toes. ? ?Sensation intact over the saphenous, lateral sural cutaneous, superficial fibular, and deep fibular nerve distributions. ? ?Tests Performed/Reviewed:  ?X-rays ? ?3 views of the right knee were reviewed. Images reveal complete loss of medial compartment joint space with near bone-on-bone contact and osteophyte formation noted. ? ?Impression:  ? ?ICD-10-CM  ?1. Primary osteoarthritis of right knee M17.11  ? ?Plan:  ? ?The patient has end-stage degenerative changes of the right knee. It was explained to the patient that the condition is progressive in nature. Having failed conservative treatment, the patient has elected to proceed with a total joint arthroplasty. The patient will undergo a total joint arthroplasty with Dr. Marry Guan. The risks of surgery, including blood clot and infection, were discussed with the patient. Measures to reduce these risks, including the use of anticoagulation, perioperative antibiotics, and early ambulation were discussed.  The importance of postoperative physical therapy was discussed with the patient. The patient elects to proceed with surgery. The patient is instructed to stop all blood thinners prior to surgery. The patient is instructed to call the hospital the day before surgery to learn of the proper arrival time.  ? ?Contact our office with any questions or concerns. Follow up as indicated, or sooner should any new problems arise, if conditions worsen, or if they are otherwise concerned.  ? ?Gwenlyn Fudge, PA -C ?[facility] ?[address] ?[place], [place] [address] ?Phone: [phone] ? ?This note was generated in part with voice recognition software and I apologize for any typographical errors that  were not detected and corrected. ? ?Electronically signed by Gwenlyn Fudge, PA at 05/24/2021 12:24 PM EDT ? ?

## 2021-05-31 MED ORDER — DEXAMETHASONE SODIUM PHOSPHATE 10 MG/ML IJ SOLN: 8.0000 mg | Freq: Once | INTRAMUSCULAR | Status: AC

## 2021-05-31 MED ORDER — CEFAZOLIN SODIUM-DEXTROSE 2-4 GM/100ML-% IV SOLN
2.0000 g | INTRAVENOUS | Status: AC
  Administered 2021-06-01: 2 g via INTRAVENOUS

## 2021-05-31 MED ORDER — GABAPENTIN 300 MG PO CAPS: 300.0000 mg | ORAL_CAPSULE | Freq: Once | ORAL | Status: AC

## 2021-05-31 MED ORDER — LACTATED RINGERS IV SOLN: INTRAVENOUS | Status: DC

## 2021-05-31 MED ORDER — CHLORHEXIDINE GLUCONATE 4 % EX LIQD: 60.0000 mL | Freq: Once | CUTANEOUS | Status: DC

## 2021-05-31 MED ORDER — CELECOXIB 200 MG PO CAPS: 400.0000 mg | ORAL_CAPSULE | Freq: Once | ORAL | Status: AC

## 2021-05-31 MED ORDER — ORAL CARE MOUTH RINSE: 15.0000 mL | Freq: Once | OROMUCOSAL | Status: AC

## 2021-05-31 MED ORDER — CHLORHEXIDINE GLUCONATE 0.12 % MT SOLN: 15.0000 mL | Freq: Once | OROMUCOSAL | Status: AC

## 2021-05-31 MED ORDER — FAMOTIDINE 20 MG PO TABS: 20.0000 mg | ORAL_TABLET | Freq: Once | ORAL | Status: AC

## 2021-05-31 MED ORDER — TRANEXAMIC ACID-NACL 1000-0.7 MG/100ML-% IV SOLN
1000.0000 mg | INTRAVENOUS | Status: AC
  Administered 2021-06-01: 1000 mg via INTRAVENOUS

## 2021-06-01 ENCOUNTER — Observation Stay
Admission: RE | Admit: 2021-06-01 | Discharge: 2021-06-02 | Disposition: A | Discharge status: Home Health | Payer: BC Managed Care – PPO | Attending: Orthopedic Surgery | Admitting: Orthopedic Surgery

## 2021-06-01 ENCOUNTER — Other Ambulatory Visit: Payer: Self-pay

## 2021-06-01 ENCOUNTER — Ambulatory Visit: Payer: BC Managed Care – PPO | Admitting: Anesthesiology

## 2021-06-01 ENCOUNTER — Encounter: Payer: Self-pay | Admitting: Orthopedic Surgery

## 2021-06-01 ENCOUNTER — Encounter
Admission: RE | Disposition: A | Discharge status: Home Health | Payer: Self-pay | Source: Home / Self Care | Attending: Orthopedic Surgery

## 2021-06-01 ENCOUNTER — Observation Stay: Payer: BC Managed Care – PPO

## 2021-06-01 DIAGNOSIS — I10 Essential (primary) hypertension: Secondary | ICD-10-CM | POA: Insufficient documentation

## 2021-06-01 DIAGNOSIS — Z79899 Other long term (current) drug therapy: Secondary | ICD-10-CM | POA: Insufficient documentation

## 2021-06-01 DIAGNOSIS — Z7982 Long term (current) use of aspirin: Secondary | ICD-10-CM | POA: Insufficient documentation

## 2021-06-01 DIAGNOSIS — E039 Hypothyroidism, unspecified: Secondary | ICD-10-CM | POA: Diagnosis not present

## 2021-06-01 DIAGNOSIS — M1711 Unilateral primary osteoarthritis, right knee: Principal | ICD-10-CM | POA: Insufficient documentation

## 2021-06-01 DIAGNOSIS — Z86010 Personal history of colonic polyps: Secondary | ICD-10-CM

## 2021-06-01 DIAGNOSIS — Z96652 Presence of left artificial knee joint: Secondary | ICD-10-CM | POA: Diagnosis not present

## 2021-06-01 DIAGNOSIS — N200 Calculus of kidney: Secondary | ICD-10-CM

## 2021-06-01 DIAGNOSIS — I493 Ventricular premature depolarization: Secondary | ICD-10-CM

## 2021-06-01 DIAGNOSIS — Z96659 Presence of unspecified artificial knee joint: Secondary | ICD-10-CM

## 2021-06-01 HISTORY — PX: KNEE ARTHROPLASTY: SHX992

## 2021-06-01 LAB — ABO/RH: ABO/RH(D): O POS

## 2021-06-01 SURGERY — ARTHROPLASTY, KNEE, TOTAL, USING IMAGELESS COMPUTER-ASSISTED NAVIGATION
Anesthesia: General | Site: Knee | Laterality: Right

## 2021-06-01 MED ORDER — FENOFIBRATE 160 MG PO TABS
160.0000 mg | ORAL_TABLET | Freq: Every day | ORAL | Status: DC
  Administered 2021-06-02: 160 mg via ORAL
  Filled 2021-06-01: qty 1

## 2021-06-01 MED ORDER — MIDAZOLAM HCL 2 MG/2ML IJ SOLN
INTRAMUSCULAR | Status: AC
  Filled 2021-06-01: qty 2

## 2021-06-01 MED ORDER — ACETAMINOPHEN 10 MG/ML IV SOLN
INTRAVENOUS | Status: AC
  Filled 2021-06-01: qty 100

## 2021-06-01 MED ORDER — DEXAMETHASONE SODIUM PHOSPHATE 10 MG/ML IJ SOLN
INTRAMUSCULAR | Status: AC
  Administered 2021-06-01: 8 mg via INTRAVENOUS
  Filled 2021-06-01: qty 1

## 2021-06-01 MED ORDER — TRAMADOL HCL 50 MG PO TABS
ORAL_TABLET | ORAL | Status: AC
  Administered 2021-06-01: 50 mg via ORAL
  Filled 2021-06-01: qty 1

## 2021-06-01 MED ORDER — PANTOPRAZOLE SODIUM 40 MG PO TBEC
40.0000 mg | DELAYED_RELEASE_TABLET | Freq: Two times a day (BID) | ORAL | Status: DC
  Administered 2021-06-01: 40 mg via ORAL
  Filled 2021-06-01 (×2): qty 1

## 2021-06-01 MED ORDER — BUPIVACAINE HCL (PF) 0.25 % IJ SOLN
INTRAMUSCULAR | Status: AC
  Filled 2021-06-01: qty 120

## 2021-06-01 MED ORDER — PANTOPRAZOLE SODIUM 40 MG PO TBEC
DELAYED_RELEASE_TABLET | ORAL | Status: AC
  Administered 2021-06-01: 40 mg via ORAL
  Filled 2021-06-01: qty 1

## 2021-06-01 MED ORDER — PROPOFOL 1000 MG/100ML IV EMUL
INTRAVENOUS | Status: AC
  Filled 2021-06-01: qty 100

## 2021-06-01 MED ORDER — PHENOL 1.4 % MT LIQD: 1.0000 | OROMUCOSAL | Status: DC | PRN

## 2021-06-01 MED ORDER — METOCLOPRAMIDE HCL 10 MG PO TABS
10.0000 mg | ORAL_TABLET | Freq: Three times a day (TID) | ORAL | Status: DC
  Administered 2021-06-01 – 2021-06-02 (×3): 10 mg via ORAL
  Filled 2021-06-01 (×3): qty 1

## 2021-06-01 MED ORDER — ACETAMINOPHEN 325 MG PO TABS: 325.0000 mg | ORAL_TABLET | Freq: Four times a day (QID) | ORAL | Status: DC | PRN

## 2021-06-01 MED ORDER — OXYCODONE HCL 5 MG/5ML PO SOLN: 5.0000 mg | Freq: Once | ORAL | Status: DC | PRN

## 2021-06-01 MED ORDER — ACETAMINOPHEN 10 MG/ML IV SOLN
1000.0000 mg | Freq: Four times a day (QID) | INTRAVENOUS | Status: AC
  Administered 2021-06-02 (×3): 1000 mg via INTRAVENOUS
  Filled 2021-06-01 (×3): qty 100

## 2021-06-01 MED ORDER — FERROUS SULFATE 325 (65 FE) MG PO TABS
325.0000 mg | ORAL_TABLET | Freq: Two times a day (BID) | ORAL | Status: DC
  Administered 2021-06-02: 325 mg via ORAL
  Filled 2021-06-01: qty 1

## 2021-06-01 MED ORDER — SODIUM CHLORIDE 0.9 % IR SOLN
Status: DC | PRN
  Administered 2021-06-01: 3000 mL

## 2021-06-01 MED ORDER — 0.9 % SODIUM CHLORIDE (POUR BTL) OPTIME
TOPICAL | Status: DC | PRN
  Administered 2021-06-01: 500 mL

## 2021-06-01 MED ORDER — OXYCODONE HCL 5 MG PO TABS: 5.0000 mg | ORAL_TABLET | ORAL | Status: DC | PRN

## 2021-06-01 MED ORDER — FLEET ENEMA 7-19 GM/118ML RE ENEM: 1.0000 | ENEMA | Freq: Once | RECTAL | Status: DC | PRN

## 2021-06-01 MED ORDER — LORATADINE 10 MG PO TABS
ORAL_TABLET | ORAL | Status: AC
Start: 2021-06-01 — End: 2021-06-01
  Administered 2021-06-01: 10 mg via ORAL
  Filled 2021-06-01: qty 1

## 2021-06-01 MED ORDER — CEFAZOLIN SODIUM-DEXTROSE 2-4 GM/100ML-% IV SOLN
INTRAVENOUS | Status: AC
  Filled 2021-06-01: qty 100

## 2021-06-01 MED ORDER — MAGNESIUM HYDROXIDE 400 MG/5ML PO SUSP
30.0000 mL | Freq: Every day | ORAL | Status: DC
  Administered 2021-06-02: 30 mL via ORAL
  Filled 2021-06-01: qty 30

## 2021-06-01 MED ORDER — FENTANYL CITRATE (PF) 100 MCG/2ML IJ SOLN
INTRAMUSCULAR | Status: AC
  Filled 2021-06-01: qty 2

## 2021-06-01 MED ORDER — ENOXAPARIN SODIUM 30 MG/0.3ML IJ SOSY
30.0000 mg | PREFILLED_SYRINGE | Freq: Two times a day (BID) | INTRAMUSCULAR | Status: DC
  Administered 2021-06-02: 30 mg via SUBCUTANEOUS
  Filled 2021-06-01: qty 0.3

## 2021-06-01 MED ORDER — CELECOXIB 200 MG PO CAPS
ORAL_CAPSULE | ORAL | Status: AC
  Administered 2021-06-01: 400 mg via ORAL
  Filled 2021-06-01: qty 2

## 2021-06-01 MED ORDER — ONDANSETRON HCL 4 MG/2ML IJ SOLN
INTRAMUSCULAR | Status: DC | PRN
  Administered 2021-06-01: 4 mg via INTRAVENOUS

## 2021-06-01 MED ORDER — BISACODYL 10 MG RE SUPP
10.0000 mg | Freq: Every day | RECTAL | Status: DC | PRN
  Filled 2021-06-01: qty 1

## 2021-06-01 MED ORDER — CHLORHEXIDINE GLUCONATE 0.12 % MT SOLN
OROMUCOSAL | Status: AC
  Administered 2021-06-01: 15 mL via OROMUCOSAL
  Filled 2021-06-01: qty 15

## 2021-06-01 MED ORDER — ENSURE PRE-SURGERY PO LIQD
296.0000 mL | Freq: Once | ORAL | Status: AC
  Administered 2021-06-01: 296 mL via ORAL
  Filled 2021-06-01: qty 296

## 2021-06-01 MED ORDER — TRANEXAMIC ACID-NACL 1000-0.7 MG/100ML-% IV SOLN
INTRAVENOUS | Status: AC
  Filled 2021-06-01: qty 100

## 2021-06-01 MED ORDER — FERROUS SULFATE 325 (65 FE) MG PO TABS
ORAL_TABLET | ORAL | Status: AC
  Administered 2021-06-01: 325 mg via ORAL
  Filled 2021-06-01: qty 1

## 2021-06-01 MED ORDER — OXYCODONE HCL 5 MG PO TABS
ORAL_TABLET | ORAL | Status: AC
  Administered 2021-06-01: 10 mg via ORAL
  Filled 2021-06-01: qty 2

## 2021-06-01 MED ORDER — CELECOXIB 200 MG PO CAPS
200.0000 mg | ORAL_CAPSULE | Freq: Two times a day (BID) | ORAL | Status: DC
  Administered 2021-06-02: 200 mg via ORAL
  Filled 2021-06-01: qty 1

## 2021-06-01 MED ORDER — ONDANSETRON HCL 4 MG/2ML IJ SOLN: 4.0000 mg | Freq: Four times a day (QID) | INTRAMUSCULAR | Status: DC | PRN

## 2021-06-01 MED ORDER — HYDROMORPHONE HCL 1 MG/ML IJ SOLN
0.5000 mg | INTRAMUSCULAR | Status: DC | PRN
  Administered 2021-06-01: 0.5 mg via INTRAVENOUS

## 2021-06-01 MED ORDER — ONDANSETRON HCL 4 MG/2ML IJ SOLN: 4.0000 mg | Freq: Once | INTRAMUSCULAR | Status: DC | PRN

## 2021-06-01 MED ORDER — DIPHENHYDRAMINE HCL 12.5 MG/5ML PO ELIX: 12.5000 mg | ORAL_SOLUTION | ORAL | Status: DC | PRN

## 2021-06-01 MED ORDER — OXYCODONE HCL 5 MG PO TABS: 5.0000 mg | ORAL_TABLET | Freq: Once | ORAL | Status: DC | PRN

## 2021-06-01 MED ORDER — OXYCODONE HCL 5 MG PO TABS
10.0000 mg | ORAL_TABLET | ORAL | Status: DC | PRN
  Administered 2021-06-01 – 2021-06-02 (×2): 10 mg via ORAL
  Filled 2021-06-01 (×2): qty 2

## 2021-06-01 MED ORDER — PHENYLEPHRINE HCL-NACL 20-0.9 MG/250ML-% IV SOLN
INTRAVENOUS | Status: DC | PRN
  Administered 2021-06-01: 20 ug/min via INTRAVENOUS

## 2021-06-01 MED ORDER — SURGIPHOR WOUND IRRIGATION SYSTEM - OPTIME
TOPICAL | Status: DC | PRN
Start: 2021-06-01 — End: 2021-06-01
  Administered 2021-06-01: 1

## 2021-06-01 MED ORDER — MAGNESIUM HYDROXIDE 400 MG/5ML PO SUSP
ORAL | Status: AC
  Administered 2021-06-01: 30 mL via ORAL
  Filled 2021-06-01: qty 30

## 2021-06-01 MED ORDER — FENTANYL CITRATE (PF) 100 MCG/2ML IJ SOLN
25.0000 ug | INTRAMUSCULAR | Status: DC | PRN
  Administered 2021-06-01 (×2): 50 ug via INTRAVENOUS
  Administered 2021-06-01 (×2): 25 ug via INTRAVENOUS

## 2021-06-01 MED ORDER — ACETAMINOPHEN 10 MG/ML IV SOLN: 1000.0000 mg | Freq: Once | INTRAVENOUS | Status: DC | PRN

## 2021-06-01 MED ORDER — SENNOSIDES-DOCUSATE SODIUM 8.6-50 MG PO TABS
1.0000 | ORAL_TABLET | Freq: Two times a day (BID) | ORAL | Status: DC
Start: 2021-06-01 — End: 2021-06-02
  Administered 2021-06-01 – 2021-06-02 (×2): 1 via ORAL
  Filled 2021-06-01 (×2): qty 1

## 2021-06-01 MED ORDER — SODIUM CHLORIDE 0.9 % IV SOLN: INTRAVENOUS | Status: DC | PRN

## 2021-06-01 MED ORDER — CEFAZOLIN SODIUM-DEXTROSE 2-4 GM/100ML-% IV SOLN
INTRAVENOUS | Status: AC
  Administered 2021-06-01: 2 g via INTRAVENOUS
  Filled 2021-06-01: qty 100

## 2021-06-01 MED ORDER — CEFAZOLIN SODIUM-DEXTROSE 2-4 GM/100ML-% IV SOLN
2.0000 g | Freq: Four times a day (QID) | INTRAVENOUS | Status: AC
  Administered 2021-06-01: 2 g via INTRAVENOUS
  Filled 2021-06-01: qty 100

## 2021-06-01 MED ORDER — FENTANYL CITRATE (PF) 100 MCG/2ML IJ SOLN
INTRAMUSCULAR | Status: DC | PRN
  Administered 2021-06-01 (×4): 25 ug via INTRAVENOUS

## 2021-06-01 MED ORDER — DIPHENHYDRAMINE HCL 25 MG PO CAPS: 25.0000 mg | ORAL_CAPSULE | Freq: Four times a day (QID) | ORAL | Status: DC | PRN

## 2021-06-01 MED ORDER — ACETAMINOPHEN 10 MG/ML IV SOLN
INTRAVENOUS | Status: AC
  Administered 2021-06-01: 1000 mg via INTRAVENOUS
  Filled 2021-06-01: qty 100

## 2021-06-01 MED ORDER — ATENOLOL 25 MG PO TABS
25.0000 mg | ORAL_TABLET | Freq: Every day | ORAL | Status: DC
  Administered 2021-06-02: 25 mg via ORAL
  Filled 2021-06-01: qty 1

## 2021-06-01 MED ORDER — MENTHOL 3 MG MT LOZG: 1.0000 | LOZENGE | OROMUCOSAL | Status: DC | PRN

## 2021-06-01 MED ORDER — TRANEXAMIC ACID-NACL 1000-0.7 MG/100ML-% IV SOLN
1000.0000 mg | Freq: Once | INTRAVENOUS | Status: AC
  Administered 2021-06-01: 1000 mg via INTRAVENOUS

## 2021-06-01 MED ORDER — SODIUM CHLORIDE 0.9 % IV SOLN: INTRAVENOUS | Status: DC

## 2021-06-01 MED ORDER — METOCLOPRAMIDE HCL 10 MG PO TABS
ORAL_TABLET | ORAL | Status: AC
  Administered 2021-06-01: 10 mg via ORAL
  Filled 2021-06-01: qty 1

## 2021-06-01 MED ORDER — MIDAZOLAM HCL 5 MG/5ML IJ SOLN
INTRAMUSCULAR | Status: DC | PRN
  Administered 2021-06-01: 2 mg via INTRAVENOUS

## 2021-06-01 MED ORDER — GLYCOPYRROLATE 0.2 MG/ML IJ SOLN
INTRAMUSCULAR | Status: DC | PRN
  Administered 2021-06-01: .2 mg via INTRAVENOUS

## 2021-06-01 MED ORDER — BUPIVACAINE LIPOSOME 1.3 % IJ SUSP
INTRAMUSCULAR | Status: AC
Start: 2021-06-01 — End: ?
  Filled 2021-06-01: qty 40

## 2021-06-01 MED ORDER — SODIUM CHLORIDE FLUSH 0.9 % IV SOLN
INTRAVENOUS | Status: AC
  Filled 2021-06-01: qty 80

## 2021-06-01 MED ORDER — LACTATED RINGERS IV SOLN: INTRAVENOUS | Status: DC

## 2021-06-01 MED ORDER — ALUM & MAG HYDROXIDE-SIMETH 200-200-20 MG/5ML PO SUSP: 30.0000 mL | ORAL | Status: DC | PRN

## 2021-06-01 MED ORDER — LEVOTHYROXINE SODIUM 50 MCG PO TABS
100.0000 ug | ORAL_TABLET | Freq: Every day | ORAL | Status: DC
  Administered 2021-06-02: 100 ug via ORAL
  Filled 2021-06-01: qty 2

## 2021-06-01 MED ORDER — HYDROMORPHONE HCL 1 MG/ML IJ SOLN
INTRAMUSCULAR | Status: AC
  Filled 2021-06-01: qty 0.5

## 2021-06-01 MED ORDER — ONDANSETRON HCL 4 MG PO TABS
4.0000 mg | ORAL_TABLET | Freq: Four times a day (QID) | ORAL | Status: DC | PRN
  Administered 2021-06-01: 4 mg via ORAL
  Filled 2021-06-01: qty 1

## 2021-06-01 MED ORDER — SODIUM CHLORIDE (PF) 0.9 % IJ SOLN
INTRAMUSCULAR | Status: DC | PRN
  Administered 2021-06-01: 120 mL via INTRAMUSCULAR

## 2021-06-01 MED ORDER — PHENYLEPHRINE HCL (PRESSORS) 10 MG/ML IV SOLN
INTRAVENOUS | Status: AC
  Filled 2021-06-01: qty 1

## 2021-06-01 MED ORDER — PROPOFOL 500 MG/50ML IV EMUL
INTRAVENOUS | Status: DC | PRN
  Administered 2021-06-01: 200 ug/kg/min via INTRAVENOUS

## 2021-06-01 MED ORDER — GABAPENTIN 300 MG PO CAPS
ORAL_CAPSULE | ORAL | Status: AC
  Administered 2021-06-01: 300 mg via ORAL
  Filled 2021-06-01: qty 1

## 2021-06-01 MED ORDER — ATORVASTATIN CALCIUM 20 MG PO TABS
20.0000 mg | ORAL_TABLET | Freq: Every day | ORAL | Status: DC
  Administered 2021-06-01: 20 mg via ORAL
  Filled 2021-06-01: qty 1

## 2021-06-01 MED ORDER — LORATADINE 10 MG PO TABS
10.0000 mg | ORAL_TABLET | Freq: Every day | ORAL | Status: DC
Start: 2021-06-01 — End: 2021-06-02
  Filled 2021-06-01: qty 1

## 2021-06-01 MED ORDER — ACETAMINOPHEN 10 MG/ML IV SOLN
INTRAVENOUS | Status: DC | PRN
  Administered 2021-06-01: 1000 mg via INTRAVENOUS

## 2021-06-01 MED ORDER — SENNOSIDES-DOCUSATE SODIUM 8.6-50 MG PO TABS
ORAL_TABLET | ORAL | Status: AC
  Administered 2021-06-01: 1 via ORAL
  Filled 2021-06-01: qty 1

## 2021-06-01 MED ORDER — FAMOTIDINE 20 MG PO TABS
ORAL_TABLET | ORAL | Status: AC
  Administered 2021-06-01: 20 mg via ORAL
  Filled 2021-06-01: qty 1

## 2021-06-01 MED ORDER — BUPIVACAINE HCL (PF) 0.5 % IJ SOLN
INTRAMUSCULAR | Status: DC | PRN
  Administered 2021-06-01: 3 mL

## 2021-06-01 MED ORDER — FUROSEMIDE 20 MG PO TABS
20.0000 mg | ORAL_TABLET | Freq: Every day | ORAL | Status: DC
Start: 2021-06-01 — End: 2021-06-02
  Filled 2021-06-01 (×2): qty 1

## 2021-06-01 MED ORDER — TRAMADOL HCL 50 MG PO TABS: 50.0000 mg | ORAL_TABLET | ORAL | Status: DC | PRN

## 2021-06-01 SURGICAL SUPPLY — 80 items
ATTUNE MED DOME PAT 41 KNEE (Knees) ×1 IMPLANT
ATTUNE PS FEM RT SZ 6 CEM KNEE (Femur) ×1 IMPLANT
ATTUNE PSRP INSR SZ6 7 KNEE (Insert) ×1 IMPLANT
BASE TIBIA ATTUNE KNEE SYS SZ6 (Knees) IMPLANT
BATTERY INSTRU NAVIGATION (MISCELLANEOUS) ×8 IMPLANT
BLADE CLIPPER SURG (BLADE) ×1 IMPLANT
BLADE SAW 70X12.5 (BLADE) ×2 IMPLANT
BLADE SAW 90X13X1.19 OSCILLAT (BLADE) ×2 IMPLANT
BLADE SAW 90X25X1.19 OSCILLAT (BLADE) ×2 IMPLANT
BONE CEMENT GENTAMICIN (Cement) ×4 IMPLANT
CEMENT BONE GENTAMICIN 40 (Cement) IMPLANT
CEMENT HV SMART SET (Cement) IMPLANT
COOLER POLAR GLACIER W/PUMP (MISCELLANEOUS) ×2 IMPLANT
CUFF TOURN SGL QUICK 24 (TOURNIQUET CUFF)
CUFF TOURN SGL QUICK 34 (TOURNIQUET CUFF)
CUFF TRNQT CYL 24X4X16.5-23 (TOURNIQUET CUFF) IMPLANT
CUFF TRNQT CYL 34X4.125X (TOURNIQUET CUFF) IMPLANT
DRAPE 3/4 80X56 (DRAPES) ×2 IMPLANT
DRAPE INCISE IOBAN 66X45 STRL (DRAPES) ×1 IMPLANT
DRSG DERMACEA 8X12 NADH (GAUZE/BANDAGES/DRESSINGS) ×2 IMPLANT
DRSG MEPILEX SACRM 8.7X9.8 (GAUZE/BANDAGES/DRESSINGS) ×2 IMPLANT
DRSG OPSITE POSTOP 4X14 (GAUZE/BANDAGES/DRESSINGS) ×2 IMPLANT
DRSG TEGADERM 4X4.75 (GAUZE/BANDAGES/DRESSINGS) ×2 IMPLANT
DURAPREP 26ML APPLICATOR (WOUND CARE) ×4 IMPLANT
ELECT CAUTERY BLADE 6.4 (BLADE) ×2 IMPLANT
ELECT REM PT RETURN 9FT ADLT (ELECTROSURGICAL) ×2
ELECTRODE REM PT RTRN 9FT ADLT (ELECTROSURGICAL) ×1 IMPLANT
EX-PIN ORTHOLOCK NAV 4X150 (PIN) ×4 IMPLANT
GLOVE BIOGEL PI IND STRL 8 (GLOVE) ×1 IMPLANT
GLOVE BIOGEL PI INDICATOR 8 (GLOVE) ×1
GLOVE SURG ENC TEXT LTX SZ7.5 (GLOVE) ×8 IMPLANT
GLOVE SURG UNDER POLY LF SZ7.5 (GLOVE) ×2 IMPLANT
GOWN STRL REUS W/ TWL LRG LVL3 (GOWN DISPOSABLE) ×2 IMPLANT
GOWN STRL REUS W/ TWL XL LVL3 (GOWN DISPOSABLE) ×1 IMPLANT
GOWN STRL REUS W/TWL LRG LVL3 (GOWN DISPOSABLE) ×2
GOWN STRL REUS W/TWL XL LVL3 (GOWN DISPOSABLE) ×1
HEMOVAC 400CC 10FR (MISCELLANEOUS) ×2 IMPLANT
HOLDER FOLEY CATH W/STRAP (MISCELLANEOUS) ×2 IMPLANT
HOLSTER ELECTROSUGICAL PENCIL (MISCELLANEOUS) ×1 IMPLANT
HOOD PEEL AWAY FLYTE STAYCOOL (MISCELLANEOUS) ×4 IMPLANT
IV NS IRRIG 3000ML ARTHROMATIC (IV SOLUTION) ×2 IMPLANT
KIT TURNOVER KIT A (KITS) ×2 IMPLANT
KNIFE SCULPS 14X20 (INSTRUMENTS) ×2 IMPLANT
LABEL OR SOLS (LABEL) ×1 IMPLANT
MANIFOLD NEPTUNE II (INSTRUMENTS) ×4 IMPLANT
NDL SAFETY ECLIPSE 18X1.5 (NEEDLE) ×1 IMPLANT
NDL SPNL 20GX3.5 QUINCKE YW (NEEDLE) ×2 IMPLANT
NEEDLE HYPO 18GX1.5 SHARP (NEEDLE)
NEEDLE SPNL 20GX3.5 QUINCKE YW (NEEDLE) ×4 IMPLANT
NS IRRIG 500ML POUR BTL (IV SOLUTION) ×2 IMPLANT
PACK TOTAL KNEE (MISCELLANEOUS) ×2 IMPLANT
PAD ABD DERMACEA PRESS 5X9 (GAUZE/BANDAGES/DRESSINGS) ×4 IMPLANT
PAD WRAPON POLAR KNEE (MISCELLANEOUS) ×1 IMPLANT
PENCIL SMOKE EVACUATOR COATED (MISCELLANEOUS) ×1 IMPLANT
PIN DRILL FIX HALF THREAD (BIT) ×4 IMPLANT
PIN DRILL QUICK PACK ×2 IMPLANT
PIN FIXATION 1/8DIA X 3INL (PIN) ×2 IMPLANT
PULSAVAC PLUS IRRIG FAN TIP (DISPOSABLE) ×2
SOL PREP PVP 2OZ (MISCELLANEOUS) ×2
SOLUTION IRRIG SURGIPHOR (IV SOLUTION) ×2 IMPLANT
SOLUTION PREP PVP 2OZ (MISCELLANEOUS) ×1 IMPLANT
SPONGE DRAIN TRACH 4X4 STRL 2S (GAUZE/BANDAGES/DRESSINGS) ×2 IMPLANT
STAPLER SKIN PROX 35W (STAPLE) ×2 IMPLANT
STOCKINETTE IMPERV 14X48 (MISCELLANEOUS) ×1 IMPLANT
STRAP TIBIA SHORT (MISCELLANEOUS) ×2 IMPLANT
SUCTION FRAZIER HANDLE 10FR (MISCELLANEOUS) ×1
SUCTION TUBE FRAZIER 10FR DISP (MISCELLANEOUS) ×1 IMPLANT
SUT VIC AB 0 CT1 36 (SUTURE) ×4 IMPLANT
SUT VIC AB 1 CT1 36 (SUTURE) ×4 IMPLANT
SUT VIC AB 2-0 CT2 27 (SUTURE) ×2 IMPLANT
SYR 20ML LL LF (SYRINGE) ×1 IMPLANT
SYR 30ML LL (SYRINGE) ×4 IMPLANT
TIBIA ATTUNE KNEE SYS BASE SZ6 (Knees) ×2 IMPLANT
TIP FAN IRRIG PULSAVAC PLUS (DISPOSABLE) ×1 IMPLANT
TOWEL OR 17X26 4PK STRL BLUE (TOWEL DISPOSABLE) ×1 IMPLANT
TOWER CARTRIDGE SMART MIX (DISPOSABLE) ×2 IMPLANT
TRAY FOLEY MTR SLVR 16FR STAT (SET/KITS/TRAYS/PACK) ×2 IMPLANT
WATER STERILE IRR 1000ML POUR (IV SOLUTION) ×1 IMPLANT
WATER STERILE IRR 500ML POUR (IV SOLUTION) ×1 IMPLANT
WRAPON POLAR PAD KNEE (MISCELLANEOUS) ×2

## 2021-06-01 NOTE — Anesthesia Procedure Notes (Signed)
Date/Time: 06/01/2021 7:23 AM ?Performed by: Nelda Marseille, CRNA ?Pre-anesthesia Checklist: Patient identified, Emergency Drugs available, Suction available, Patient being monitored and Timeout performed ?Oxygen Delivery Method: Simple face mask ? ? ? ? ?

## 2021-06-01 NOTE — Evaluation (Signed)
Physical Therapy Evaluation ?Patient Details ?Name: Theresa Thomas ?MRN: [id number] ?DOB: September 06, 1960 ?Today's Date: 06/01/2021 ? ?History of Present Illness ? Patient is a 61 year old female admitted for R TKA. PMH includes L TKA, hypothyroid, wears Cpap at home  ?Clinical Impression ? Patient received in bed, she is agreeable to PT assessment. She reports mild pain. Patient is mod independent with bed mobility. Transfers with min guard and cues. Ambulated 60 feet with RW and min guard. Cues for sequencing and safety. Patient will continue to benefit from skilled PT while here to improve strength, rom and functional independence for return home safely.   ?   ? ?Recommendations for follow up therapy are one component of a multi-disciplinary discharge planning process, led by the attending physician.  Recommendations may be updated based on patient status, additional functional criteria and insurance authorization. ? ?Follow Up Recommendations Home health PT ? ?  ?Assistance Recommended at Discharge Intermittent Supervision/Assistance  ?Patient can return home with the following ? A little help with walking and/or transfers;A little help with bathing/dressing/bathroom;Assist for transportation;Help with stairs or ramp for entrance;Assistance with cooking/housework ? ?  ?Equipment Recommendations None recommended by PT  ?Recommendations for Other Services ?    ?  ?Functional Status Assessment Patient has had a recent decline in their functional status and demonstrates the ability to make significant improvements in function in a reasonable and predictable amount of time.  ? ?  ?Precautions / Restrictions Precautions ?Precautions: Fall ?Restrictions ?Weight Bearing Restrictions: Yes ?RLE Weight Bearing: Weight bearing as tolerated  ? ?  ? ?Mobility ? Bed Mobility ?Overal bed mobility: Modified Independent ?  ?  ?  ?  ?  ?  ?  ?  ? ?Transfers ?Overall transfer level: Needs assistance ?Equipment used: Rolling walker (2  wheels) ?Transfers: Sit to/from Stand ?Sit to Stand: Min guard ?  ?  ?  ?  ?  ?General transfer comment: cues for hand placement, safety ?  ? ?Ambulation/Gait ?Ambulation/Gait assistance: Min guard ?Gait Distance (Feet): 60 Feet ?Assistive device: Rolling walker (2 wheels) ?Gait Pattern/deviations: Step-to pattern, Decreased step length - left, Decreased weight shift to right ?Gait velocity: decr ?  ?  ?General Gait Details: patient ambulating well with cues for safety/sequencing ? ?Stairs ?  ?  ?  ?  ?  ? ?Wheelchair Mobility ?  ? ?Modified Rankin (Stroke Patients Only) ?  ? ?  ? ?Balance Overall balance assessment: Needs assistance ?Sitting-balance support: Feet supported ?Sitting balance-Leahy Scale: Good ?  ?  ?Standing balance support: Bilateral upper extremity supported, During functional activity, Reliant on assistive device for balance ?Standing balance-Leahy Scale: Good ?Standing balance comment: Able to perform hand washing, pulling up underwear without UE support ?  ?  ?  ?  ?  ?  ?  ?  ?  ?  ?  ?   ? ? ? ?Pertinent Vitals/Pain Pain Assessment ?Pain Assessment: Faces ?Faces Pain Scale: Hurts little more ?Pain Location: R knee ?Pain Descriptors / Indicators: Discomfort, Sore, Operative site guarding, Burning, Sharp ?Pain Intervention(s): Limited activity within patient's tolerance, Monitored during session, Premedicated before session, Repositioned  ? ? ?Home Living Family/patient expects to be discharged to:: Private residence ?Living Arrangements: Alone ?Available Help at Discharge: Family;Available PRN/intermittently ?Type of Home: House ?Home Access: Stairs to enter ?Entrance Stairs-Rails: Right ?Entrance Stairs-Number of Steps: 3 ?  ?Home Layout: One level ?Home Equipment: Conservation officer, nature (2 wheels) ?   ?  ?Prior Function Prior Level of Function :  Independent/Modified Independent;Driving ?  ?  ?  ?  ?  ?  ?Mobility Comments: patient reports she was not using AD prior, but not walking well.  Retired ?ADLs Comments: independent ?  ? ? ?Hand Dominance  ?   ? ?  ?Extremity/Trunk Assessment  ? Upper Extremity Assessment ?Upper Extremity Assessment: Defer to OT evaluation ?  ? ?Lower Extremity Assessment ?Lower Extremity Assessment: RLE deficits/detail ?RLE Deficits / Details: painful, appropriate for post surgical ?RLE Coordination: decreased gross motor ?  ? ?Cervical / Trunk Assessment ?Cervical / Trunk Assessment: Normal  ?Communication  ? Communication: No difficulties  ?Cognition Arousal/Alertness: Awake/alert ?Behavior During Therapy: [facility] for tasks assessed/performed ?Overall Cognitive Status: Within Functional Limits for tasks assessed ?  ?  ?  ?  ?  ?  ?  ?  ?  ?  ?  ?  ?  ?  ?  ?  ?  ?  ?  ? ?  ?General Comments   ? ?  ?Exercises Total Joint Exercises ?Ankle Circles/Pumps: AROM, Both, 15 reps ?Goniometric ROM: 0-70  ? ?Assessment/Plan  ?  ?PT Assessment Patient needs continued PT services  ?PT Problem List Decreased strength;Decreased mobility;Obesity;Pain;Decreased range of motion;Decreased activity tolerance;Decreased safety awareness ? ?   ?  ?PT Treatment Interventions DME instruction;Therapeutic exercise;Gait training;Stair training;Functional mobility training;Therapeutic activities;Patient/family education;Neuromuscular re-education   ? ?PT Goals (Current goals can be found in the Care Plan section)  ?Acute Rehab PT Goals ?Patient Stated Goal: to return home tomorrow ?PT Goal Formulation: With patient ?Time For Goal Achievement: 06/05/21 ?Potential to Achieve Goals: Good ? ?  ?Frequency BID ?  ? ? ?Co-evaluation   ?  ?  ?  ?  ? ? ?  ?AM-PAC PT "6 Clicks" Mobility  ?Outcome Measure Help needed turning from your back to your side while in a flat bed without using bedrails?: A Little ?Help needed moving from lying on your back to sitting on the side of a flat bed without using bedrails?: A Little ?Help needed moving to and from a bed to a chair (including a wheelchair)?: A Little ?Help needed  standing up from a chair using your arms (e.g., wheelchair or bedside chair)?: A Little ?Help needed to walk in hospital room?: A Little ?Help needed climbing 3-5 steps with a railing? : A Little ?6 Click Score: 18 ? ?  ?End of Session Equipment Utilized During Treatment: Gait belt;Oxygen ?Activity Tolerance: Patient tolerated treatment well;Patient limited by pain ?Patient left: in chair;with call bell/phone within reach ?Nurse Communication: Mobility status;Other (comment) (left patient on room air with sats at 97%) ?PT Visit Diagnosis: Other abnormalities of gait and mobility (R26.89);Muscle weakness (generalized) (M62.81);Difficulty in walking, not elsewhere classified (R26.2);Pain ?Pain - Right/Left: Right ?Pain - part of body: Knee ?  ? ?Time: 8527-7824 ?PT Time Calculation (min) (ACUTE ONLY): 46 min ? ? ?Charges:   PT Evaluation ?$PT Eval Moderate Complexity: 1 Mod ?PT Treatments ?$Gait Training: 23-37 mins ?  ?   ? ?Amanda Cockayne, PT, GCS ?06/01/21,2:39 PM ? ?

## 2021-06-01 NOTE — Anesthesia Postprocedure Evaluation (Signed)
Anesthesia Post Note ? ?Patient: Theresa Thomas ? ?Procedure(s) Performed: COMPUTER ASSISTED TOTAL KNEE ARTHROPLASTY (Right: Knee) ? ?Patient location during evaluation: PACU ?Anesthesia Type: Spinal ?Level of consciousness: awake and alert, oriented and patient cooperative ?Pain management: pain level controlled ?Vital Signs Assessment: post-procedure vital signs reviewed and stable ?Respiratory status: spontaneous breathing, nonlabored ventilation and respiratory function stable ?Cardiovascular status: blood pressure returned to baseline and stable ?Postop Assessment: adequate PO intake, no headache and spinal receding ?Anesthetic complications: no ? ? ?No notable events documented. ? ? ?Last Vitals:  ?Vitals:  ? 06/01/21 1430 06/01/21 1507  ?BP:  113/64  ?Pulse:  (!) 57  ?Resp:  18  ?Temp:  36.6 ?C  ?SpO2: 97% 100%  ?  ?Last Pain:  ?Vitals:  ? 06/01/21 1507  ?TempSrc: Temporal  ?PainSc: 4   ? ? ?  ?  ?  ?  ?  ?  ? ?Darrin Nipper ? ? ? ? ?

## 2021-06-01 NOTE — Op Note (Signed)
OPERATIVE NOTE ? ?DATE OF SURGERY:  06/01/2021 ? ?PATIENT NAME:  Theresa Thomas   ?DOB: July 14, 1960  ?MRN: [id number] ? ?PRE-OPERATIVE DIAGNOSIS: Degenerative arthrosis of the right knee, primary ? ?POST-OPERATIVE DIAGNOSIS:  Same ? ?PROCEDURE:  Right total knee arthroplasty using computer-assisted navigation ? ?SURGEON:  Marciano Sequin. M.D. ? ?ASSISTANT: Cassell Smiles, PA-C (present and scrubbed throughout the case, critical for assistance with exposure, retraction, instrumentation, and closure) ? ?ANESTHESIA: spinal ? ?ESTIMATED BLOOD LOSS: 75 mL ? ?FLUIDS REPLACED: 1300 mL of crystalloid ? ?TOURNIQUET TIME: 95 minutes ? ?DRAINS: 2 medium Hemovac drains ? ?SOFT TISSUE RELEASES: Anterior cruciate ligament, posterior cruciate ligament, deep medial collateral ligament, patellofemoral ligament ? ?IMPLANTS UTILIZED: DePuy Attune size 6 posterior stabilized femoral component (cemented), size 6 rotating platform tibial component (cemented), 41 mm medialized dome patella (cemented), and a 7 mm stabilized rotating platform polyethylene insert. ? ?INDICATIONS FOR SURGERY: Theresa Thomas is a 61 y.o. year old female with a long history of progressive knee pain. X-rays demonstrated severe degenerative changes in tricompartmental fashion. The patient had not seen any significant improvement despite conservative nonsurgical intervention. After discussion of the risks and benefits of surgical intervention, the patient expressed understanding of the risks benefits and agree with plans for total knee arthroplasty.  ? ?The risks, benefits, and alternatives were discussed at length including but not limited to the risks of infection, bleeding, nerve injury, stiffness, blood clots, the need for revision surgery, cardiopulmonary complications, among others, and they were willing to proceed. ? ?PROCEDURE IN DETAIL: The patient was brought into the operating room and, after adequate spinal anesthesia was achieved, a tourniquet was  placed on the patient's upper thigh. The patient's knee and leg were cleaned and prepped with alcohol and DuraPrep and draped in the usual sterile fashion. A "timeout" was performed as per usual protocol. The lower extremity was exsanguinated using an Esmarch, and the tourniquet was inflated to 300 mmHg. An anterior longitudinal incision was made followed by a standard mid vastus approach. The deep fibers of the medial collateral ligament were elevated in a subperiosteal fashion off of the medial flare of the tibia so as to maintain a continuous soft tissue sleeve. The patella was subluxed laterally and the patellofemoral ligament was incised. Inspection of the knee demonstrated severe degenerative changes with full-thickness loss of articular cartilage. Osteophytes were debrided using a rongeur. Anterior and posterior cruciate ligaments were excised. Two 4.0 mm Schanz pins were inserted in the femur and into the tibia for attachment of the array of trackers used for computer-assisted navigation. Hip center was identified using a circumduction technique. Distal landmarks were mapped using the computer. The distal femur and proximal tibia were mapped using the computer. The distal femoral cutting guide was positioned using computer-assisted navigation so as to achieve a 5? distal valgus cut. The femur was sized and it was felt that a size 6 femoral component was appropriate. A size 6 femoral cutting guide was positioned and the anterior cut was performed and verified using the computer. This was followed by completion of the posterior and chamfer cuts. Femoral cutting guide for the central box was then positioned in the center box cut was performed. ? ?Attention was then directed to the proximal tibia. Medial and lateral menisci were excised. The extramedullary tibial cutting guide was positioned using computer-assisted navigation so as to achieve a 0? varus-valgus alignment and 3? posterior slope. The cut was  performed and verified using the computer. The proximal tibia was  sized and it was felt that a size 6 tibial tray was appropriate. Tibial and femoral trials were inserted followed by insertion of a 7 mm polyethylene insert. This allowed for excellent mediolateral soft tissue balancing both in flexion and in full extension. Finally, the patella was cut and prepared so as to accommodate a 41 mm medialized dome patella. A patella trial was placed and the knee was placed through a range of motion with excellent patellar tracking appreciated. The femoral trial was removed after debridement of posterior osteophytes. The central post-hole for the tibial component was reamed followed by insertion of a keel punch. Tibial trials were then removed. Cut surfaces of bone were irrigated with copious amounts of normal saline using pulsatile lavage and then suctioned dry. Polymethylmethacrylate cement with gentamicin was prepared in the usual fashion using a vacuum mixer. Cement was applied to the cut surface of the proximal tibia as well as along the undersurface of a size 6 rotating platform tibial component. Tibial component was positioned and impacted into place. Excess cement was removed using [employer] fast streamer. Cement was then applied to the cut surfaces of the femur as well as along the posterior flanges of the size 6 femoral component. The femoral component was positioned and impacted into place. Excess cement was removed using [employer] fast streamer. A 7 mm polyethylene trial was inserted and the knee was brought into full extension with steady axial compression applied. Finally, cement was applied to the backside of a 41 mm medialized dome patella and the patellar component was positioned and patellar clamp applied. Excess cement was removed using [employer] fast streamer. After adequate curing of the cement, the tourniquet was deflated after a total tourniquet time of 95 minutes. Hemostasis was achieved using electrocautery. The knee was  irrigated with copious amounts of normal saline using pulsatile lavage followed by 450 ml of Surgiphor and then suctioned dry. 20 mL of 1.3% Exparel and 60 mL of 0.25% Marcaine in 40 mL of normal saline was injected along the posterior capsule, medial and lateral gutters, and along the arthrotomy site. A 7 mm stabilized rotating platform polyethylene insert was inserted and the knee was placed through a range of motion with excellent mediolateral soft tissue balancing appreciated and excellent patellar tracking noted. 2 medium drains were placed in the wound bed and brought out through separate stab incisions. The medial parapatellar portion of the incision was reapproximated using interrupted sutures of #1 Vicryl. Subcutaneous tissue was approximated in layers using first #0 Vicryl followed #2-0 Vicryl. The skin was approximated with skin staples. A sterile dressing was applied. ? ?The patient tolerated the procedure well and was transported to the recovery room in stable condition.   ? ?Rashana Andrew P. Holley Bouche., M.D.  ?

## 2021-06-01 NOTE — H&P (Signed)
The patient has been re-examined, and the chart reviewed, and there have been no interval changes to the documented history and physical.    The risks, benefits, and alternatives have been discussed at length. The patient expressed understanding of the risks benefits and agreed with plans for surgical intervention.  Alijah Akram P. Jannie Doyle, Jr. M.D.    

## 2021-06-01 NOTE — Anesthesia Preprocedure Evaluation (Addendum)
Anesthesia Evaluation  ?Patient identified by MRN, date of birth, ID band ?Patient awake ? ? ? ?Reviewed: ?Allergy & Precautions, NPO status , Patient's Chart, lab work & pertinent test results ? ?History of Anesthesia Complications ?Negative for: history of anesthetic complications ? ?Airway ?Mallampati: IV ? ? ?Neck ROM: Full ? ? ? Dental ?no notable dental hx. ? ?  ?Pulmonary ?sleep apnea and Continuous Positive Airway Pressure Ventilation ,  ?  ?Pulmonary exam normal ?breath sounds clear to auscultation ? ? ? ? ? ? Cardiovascular ?hypertension, Normal cardiovascular exam ?Rhythm:Regular Rate:Normal ? ?ECG 05/20/21:  ?Normal sinus rhythm ?Incomplete right bundle branch block ?Cannot rule out Anterior infarct , age undetermined ?  ?Neuro/Psych ? Headaches, Chronic lower back pain ?  ? GI/Hepatic ?negative GI ROS,   ?Endo/Other  ?Hypothyroidism Class 3 obesity ? Renal/GU ?Renal disease (nephrolithiasis)  ? ?  ?Musculoskeletal ? ? Abdominal ?  ?Peds ? Hematology ?negative hematology ROS ?(+)   ?Anesthesia Other Findings ? ? Reproductive/Obstetrics ? ?  ? ? ? ? ? ? ? ? ? ? ? ? ? ?  ?  ? ? ? ? ? ? ? ?Anesthesia Physical ?Anesthesia Plan ? ?ASA: 3 ? ?Anesthesia Plan: General and Spinal  ? ?Post-op Pain Management:   ? ?Induction: Intravenous ? ?PONV Risk Score and Plan: 3 and Propofol infusion, TIVA, Treatment may vary due to age or medical condition and Ondansetron ? ?Airway Management Planned: Natural Airway and Nasal Cannula ? ?Additional Equipment:  ? ?Intra-op Plan:  ? ?Post-operative Plan:  ? ?Informed Consent: I have reviewed the patients History and Physical, chart, labs and discussed the procedure including the risks, benefits and alternatives for the proposed anesthesia with the patient or authorized representative who has indicated his/her understanding and acceptance.  ? ? ? ? ? ?Plan Discussed with: CRNA ? ?Anesthesia Plan Comments: (Plan for spinal and GA with natural  airway, LMA/GETA backup.  Patient consented for risks of anesthesia including but not limited to:  ?- adverse reactions to medications ?- damage to eyes, teeth, lips or other oral mucosa ?- nerve damage due to positioning  ?- sore throat or hoarseness ?- headache, bleeding, infection, nerve damage 2/2 spinal ?- damage to heart, brain, nerves, lungs, other parts of body or loss of life ? ?Informed patient about role of CRNA in peri- and intra-operative care.  Patient voiced understanding.)  ? ? ? ? ? ? ?Anesthesia Quick Evaluation ? ?

## 2021-06-01 NOTE — Transfer of Care (Signed)
Immediate Anesthesia Transfer of Care Note ? ?Patient: Theresa Thomas ? ?Procedure(s) Performed: COMPUTER ASSISTED TOTAL KNEE ARTHROPLASTY (Right: Knee) ? ?Patient Location: PACU ? ?Anesthesia Type:Spinal ? ?Level of Consciousness: awake, drowsy and patient cooperative ? ?Airway & Oxygen Therapy: Patient Spontanous Breathing ? ?Post-op Assessment: Report given to RN and Post -op Vital signs reviewed and stable ? ?Post vital signs: Reviewed and stable ? ?Last Vitals:  ?Vitals Value Taken Time  ?BP 101/65 06/01/21 1130  ?Temp    ?Pulse 64 06/01/21 1131  ?Resp 13 06/01/21 1131  ?SpO2 97 % 06/01/21 1131  ?Vitals shown include unvalidated device data. ? ?Last Pain:  ?Vitals:  ? 06/01/21 0626  ?TempSrc: Temporal  ?PainSc: 0-No pain  ?   ? ?  ? ?Complications: No notable events documented. ?

## 2021-06-01 NOTE — Anesthesia Procedure Notes (Signed)
Spinal ? ?Patient location during procedure: OR ?Start time: 06/01/2021 7:19 AM ?End time: 06/01/2021 7:23 AM ?Reason for block: surgical anesthesia ?Staffing ?Performed: resident/CRNA  ?Anesthesiologist: Piscitello, Precious Haws, MD ?Resident/CRNA: Nelda Marseille, CRNA ?Preanesthetic Checklist ?Completed: patient identified, IV checked, site marked, risks and benefits discussed, surgical consent, monitors and equipment checked, pre-op evaluation and timeout performed ?Spinal Block ?Patient position: sitting ?Prep: ChloraPrep ?Patient monitoring: heart rate, continuous pulse ox, blood pressure and cardiac monitor ?Approach: midline ?Location: L3-4 ?Injection technique: single-shot ?Needle ?Needle type: Whitacre and Introducer  ?Needle gauge: 25 G ?Needle length: 9 cm ?Assessment ?Sensory level: T10 ?Events: CSF return ?Additional Notes ?Sterile aseptic technique used throughout the procedure.  Negative paresthesia. Negative blood return. Positive free-flowing CSF. Expiration date of kit checked and confirmed. Patient tolerated procedure well, without complications. ? ? ? ? ? ?

## 2021-06-01 NOTE — TOC Progression Note (Signed)
Transition of Care (TOC) - Progression Note  ? ? ?Patient Details  ?Name: Theresa Thomas ?MRN: [id number] ?Date of Birth: 1960/08/25 ? ?Transition of Care (TOC) CM/SW Contact  ?Conception Oms, RN ?Phone Number: ?06/01/2021, 1:14 PM ? ?Clinical Narrative:    ? ?Patient is set up with [facility] services prior to surgery by Surgeon's office ? ?  ?  ? ?Expected Discharge Plan and Services ?  ?  ?  ?  ?  ?                ?  ?  ?  ?  ?  ?  ?  ?  ?  ?  ? ? ?Social Determinants of Health (SDOH) Interventions ?  ? ?Readmission Risk Interventions ?   ? View : No data to display.  ?  ?  ?  ? ? ?

## 2021-06-02 DIAGNOSIS — M1711 Unilateral primary osteoarthritis, right knee: Secondary | ICD-10-CM | POA: Diagnosis not present

## 2021-06-02 MED ORDER — TRAMADOL HCL 50 MG PO TABS
50.0000 mg | ORAL_TABLET | ORAL | 0 refills | Status: DC | PRN
Start: 2021-06-02 — End: 2022-09-09

## 2021-06-02 MED ORDER — CELECOXIB 200 MG PO CAPS: 200.0000 mg | ORAL_CAPSULE | Freq: Two times a day (BID) | ORAL | 0 refills | Status: DC

## 2021-06-02 MED ORDER — OXYCODONE HCL 5 MG PO TABS
5.0000 mg | ORAL_TABLET | ORAL | 0 refills | Status: DC | PRN
Start: 2021-06-02 — End: 2022-09-09

## 2021-06-02 MED ORDER — DOCUSATE SODIUM 100 MG PO CAPS
100.0000 mg | ORAL_CAPSULE | Freq: Two times a day (BID) | ORAL | 0 refills | Status: DC
Start: 2021-06-02 — End: 2022-09-09

## 2021-06-02 MED ORDER — ENOXAPARIN SODIUM 40 MG/0.4ML IJ SOSY: 40.0000 mg | PREFILLED_SYRINGE | INTRAMUSCULAR | 0 refills | Status: DC

## 2021-06-02 NOTE — Progress Notes (Signed)
Post-op dressing removed. , Hemovac removed., Mini compression dressing applied. Distal half of honeycomb saturated with blood, and Fresh honeycomb dressing applied.  ?

## 2021-06-02 NOTE — Discharge Summary (Addendum)
?Physician Discharge Summary  ?Patient ID: ?Theresa Thomas ?MRN: [id number] ?DOB/AGE: 59-Feb-1962 59 y.o. ? ?Admit date: 06/01/2021 ?Discharge date: 06/02/2021 ? ?Admission Diagnoses:  ?Total knee replacement status [Z96.659] ? ?Surgeries:Procedure(s): ?Right total knee arthroplasty using computer-assisted navigation ?  ?SURGEON:  Marciano Sequin. M.D. ?  ?ASSISTANT: Cassell Smiles, PA-C (present and scrubbed throughout the case, critical for assistance with exposure, retraction, instrumentation, and closure) ?  ?ANESTHESIA: spinal ?  ?ESTIMATED BLOOD LOSS: 75 mL ?  ?FLUIDS REPLACED: 1300 mL of crystalloid ?  ?TOURNIQUET TIME: 95 minutes ?  ?DRAINS: 2 medium Hemovac drains ?  ?SOFT TISSUE RELEASES: Anterior cruciate ligament, posterior cruciate ligament, deep medial collateral ligament, patellofemoral ligament ?  ?IMPLANTS UTILIZED: DePuy Attune size 6 posterior stabilized femoral component (cemented), size 6 rotating platform tibial component (cemented), 41 mm medialized dome patella (cemented), and a 7 mm stabilized rotating platform polyethylene insert. ? ?Discharge Diagnoses: ?Patient Active Problem List  ? Diagnosis Date Noted  ? Total knee replacement status 06/01/2021  ? History of colonic polyps   ? Polyp of sigmoid colon   ? Vitamin D deficiency 07/25/2019  ? Mixed hyperlipidemia 04/08/2018  ? Total knee replacement status, left 01/07/2018  ? Primary osteoarthritis of right knee 10/12/2017  ? Asymptomatic microscopic hematuria 03/09/2017  ? Nephrolithiasis 09/05/2016  ? Obesity (BMI 30-39.9) 06/06/2016  ? Internal derangement of knee joint, left 02/08/2016  ? Abnormal ECG 01/18/2016  ? Benign essential HTN 01/18/2016  ? 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 Tear of meniscus of knee 07/09/2015  ? Elevation of level of transaminase and lactic acid dehydrogenase (LDH) 05/30/2014  ? Hypothyroidism, unspecified 05/30/2014  ? Obstructive sleep apnea 05/29/2014  ? Pedal edema 10/28/2012  ? Hypertension 10/02/2011  ? ? ?Past  Medical History:  ?Diagnosis Date  ? Arthritis   ? Family history of adverse reaction to anesthesia   ? sister has PONv  ? Headache   ? MIGRAINES  ? History of kidney stones   ? Hypertension   ? Hypothyroidism   ? Sleep apnea   ? CPAP  ? 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 ?  ?Transfusion:  ?  ?Consultants (if any):  ? ?Discharged Condition: Improved ? ?Hospital Course: LAURI Thomas is an 61 y.o. female who was admitted 06/01/2021 with a diagnosis of right knee osteoarthritis and went to the operating room on 06/01/2021 and underwent right total knee arthroplasty. The patient received perioperative antibiotics for prophylaxis (see below). The patient tolerated the procedure well and was transported to PACU in stable condition. After meeting PACU criteria, the patient was subsequently transferred to the Orthopaedics/Rehabilitation unit.  ? ?The patient received DVT prophylaxis in the form of early mobilization, Lovenox, TED hose, and SCDs . A sacral pad had been placed and heels were elevated off of the bed with rolled towels in order to protect skin integrity. Foley catheter was discontinued on postoperative day #0. Wound drains were discontinued on postoperative day #1. The surgical incision was healing well without signs of infection. ? ?Physical therapy was initiated postoperatively for transfers, gait training, and strengthening. Occupational therapy was initiated for activities of daily living and evaluation for assisted devices. Rehabilitation goals were reviewed in detail with the patient. The patient made steady progress with physical therapy and physical therapy recommended discharge to Home.  ? ?The patient achieved the preliminary goals of this hospitalization and was felt to be medically and orthopaedically appropriate for discharge. ? ?She was given perioperative antibiotics:  ?  Anti-infectives (From admission, onward)  ? ? Start     Dose/Rate Route Frequency Ordered Stop  ? 06/01/21 1330   ceFAZolin (ANCEF) IVPB 2g/100 mL premix       ? 2 g ?200 mL/hr over 30 Minutes Intravenous Every 6 hours 06/01/21 1256 06/01/21 2046  ? 06/01/21 0620  ceFAZolin (ANCEF) 2-4 GM/100ML-% IVPB       ?Note to Pharmacy: Theresa Thomas B: cabinet override  ?    06/01/21 0620 06/01/21 0739  ? 06/01/21 0600  ceFAZolin (ANCEF) IVPB 2g/100 mL premix       ? 2 g ?200 mL/hr over 30 Minutes Intravenous On call to O.R. 05/31/21 2207 06/01/21 0748  ? ?  ?. ? ?Recent vital signs:  ?Vitals:  ? 06/02/21 0758 06/02/21 0946  ?BP: (!) 111/53 (!) 117/55  ?Pulse: 64 66  ?Resp: 16   ?Temp: (!) 97.5 ?F (36.4 ?C)   ?SpO2: 100%   ? ? ?Recent laboratory studies:  ?No results for input(s): WBC, HGB, HCT, PLT, K, CL, CO2, BUN, CREATININE, GLUCOSE, CALCIUM, LABPT, INR in the last 72 hours. ? ?Diagnostic Studies: DG Knee Right Port ? ?Result Date: 06/01/2021 ?CLINICAL DATA:  Postoperative right knee arthroplasty EXAM: PORTABLE RIGHT KNEE - 1-2 VIEW COMPARISON:  None. FINDINGS: Status post right knee total arthroplasty with expected overlying postoperative change. No evidence of perihardware fracture or component malpositioning. IMPRESSION: Status post right knee total arthroplasty with expected overlying postoperative change. No evidence of perihardware fracture or component malpositioning. Electronically Signed   By: Delanna Ahmadi M.D.   On: 06/01/2021 11:54   ? ?Discharge Medications:   ?Allergies as of 06/02/2021   ? ?   Reactions  ? Tape Rash  ? Cortisone Rash  ? Had a rash that lasted 3 weeks. Was given with marcaine and xylocaine. Not sure which of the three she had a reaction to.  ? Ethyl Chloride Rash  ? Gets a long acting rash from spray that last up to a month  ? Lidocaine Hcl Rash  ? ?  ? ?  ?Medication List  ?  ? ?STOP taking these medications   ? ?aspirin EC 81 MG tablet ?  ? ?  ? ?TAKE these medications   ? ?acetaminophen 500 MG tablet ?Commonly known as: TYLENOL ?Take 1,000 mg by mouth every 6 (six) hours as needed for moderate pain  or headache. ?  ?aspirin-acetaminophen-caffeine 250-250-65 MG tablet ?Commonly known as: Sully ?Take 3 tablets by mouth 2 (two) times daily as needed for headache. ?  ?atenolol 50 MG tablet ?Commonly known as: TENORMIN ?Take 25 mg by mouth daily. ?  ?atorvastatin 20 MG tablet ?Commonly known as: LIPITOR ?Take 20 mg by mouth at bedtime. ?  ?celecoxib 200 MG capsule ?Commonly known as: CELEBREX ?Take 1 capsule (200 mg total) by mouth 2 (two) times daily. ?  ?cetirizine 10 MG tablet ?Commonly known as: ZYRTEC ?Take 10 mg by mouth daily as needed for allergies. ?  ?diphenhydrAMINE 25 MG tablet ?Commonly known as: BENADRYL ?Take 25 mg by mouth every 6 (six) hours as needed for allergies or sleep. ?  ?docusate sodium 100 MG capsule ?Commonly known as: Colace ?Take 1 capsule (100 mg total) by mouth 2 (two) times daily. ?  ?enoxaparin 40 MG/0.4ML injection ?Commonly known as: LOVENOX ?Inject 0.4 mLs (40 mg total) into the skin daily for 14 days. ?  ?fenofibrate 145 MG tablet ?Commonly known as: TRICOR ?Take 145 mg by mouth daily. ?  ?furosemide  20 MG tablet ?Commonly known as: LASIX ?Take 20 mg by mouth daily. ?  ?levothyroxine 100 MCG tablet ?Commonly known as: SYNTHROID ?Take 100 mcg by mouth daily before breakfast. ?  ?oxyCODONE 5 MG immediate release tablet ?Commonly known as: Oxy IR/ROXICODONE ?Take 1 tablet (5 mg total) by mouth every 4 (four) hours as needed for severe pain. ?  ?traMADol 50 MG tablet ?Commonly known as: ULTRAM ?Take 1 tablet (50 mg total) by mouth every 4 (four) hours as needed for moderate pain. ?  ? ?  ? ?  ?  ? ? ?  ?Durable Medical Equipment  ?(From admission, onward)  ?  ? ? ?  ? ?  Start     Ordered  ? 06/01/21 1256  DME Walker rolling  Once       ?Question:  Patient needs a walker to treat with the following condition  Answer:  Total knee replacement status  ? 06/01/21 1256  ? 06/01/21 1256  DME Bedside commode  Once       ?Question:  Patient needs a bedside commode to treat with  the following condition  Answer:  Total knee replacement status  ? 06/01/21 1256  ? ?  ?  ? ?  ? ? ?Disposition: Home with home health PT ? ? ? ? Follow-up Information   ? ? Tamala Julian B, PA-C Follow up

## 2021-06-02 NOTE — Progress Notes (Signed)
[name] to be D/C'd Home with home health per MD order.  Discussed with the patient and all questions fully answered. ? ?IV catheter discontinued intact. Site without signs and symptoms of complications. Dressing and pressure applied. TED hose placed on patient. ? ?An After Visit Summary was printed and given to the patient. Patient received two honeycomb dressings and prescriptions sent to pharmacy. Polarcare sent with patient. ? ?D/c education completed with patient/family including follow up instructions, medication list, d/c activities limitations if indicated, with other d/c instructions as indicated by MD - patient able to verbalize understanding, all questions fully answered.  ? ?Patient instructed to return to ED, call 911, or call MD for any changes in condition.  ? ?Patient escorted via [place], and D/C home via private auto. ? ?Manuella Ghazi ?06/02/2021 1:59 PM  ?

## 2021-06-02 NOTE — Progress Notes (Signed)
Physical Therapy Treatment ?Patient Details ?Name: Theresa Thomas ?MRN: [id number] ?DOB: 1960-10-08 ?Today's Date: 06/02/2021 ? ? ?History of Present Illness Patient is a 61 year old female admitted for R TKA. PMH includes L TKA, hypothyroid, wears Cpap at home ? ?  ?PT Comments  ? ? Patient received in bed, she reports not sleeping much last night. Agrees to PT session. She is mod independent with bed mobility. Transfers with supervision. Ambulated 150 feet with RW and min guard. She will continue to benefit from skilled PT while here to improve functional independence, strength and rom.  ?   ?Recommendations for follow up therapy are one component of a multi-disciplinary discharge planning process, led by the attending physician.  Recommendations may be updated based on patient status, additional functional criteria and insurance authorization. ? ?Follow Up Recommendations ? Home health PT ?  ?  ?Assistance Recommended at Discharge Intermittent Supervision/Assistance  ?Patient can return home with the following A little help with walking and/or transfers;A little help with bathing/dressing/bathroom;Assist for transportation;Help with stairs or ramp for entrance;Assistance with cooking/housework ?  ?Equipment Recommendations ? None recommended by PT  ?  ?Recommendations for Other Services   ? ? ?  ?Precautions / Restrictions Precautions ?Precautions: Fall ?Restrictions ?Weight Bearing Restrictions: Yes ?RLE Weight Bearing: Weight bearing as tolerated  ?  ? ?Mobility ? Bed Mobility ?Overal bed mobility: Modified Independent ?  ?  ?  ?  ?  ?  ?  ?  ? ?Transfers ?Overall transfer level: Needs assistance ?Equipment used: Rolling walker (2 wheels) ?Transfers: Sit to/from Stand ?Sit to Stand: Min guard ?  ?  ?  ?  ?  ?General transfer comment: cues for hand placement, safety ?  ? ?Ambulation/Gait ?Ambulation/Gait assistance: Min guard ?Gait Distance (Feet): 150 Feet ?Assistive device: Rolling walker (2 wheels) ?Gait  Pattern/deviations: Step-to pattern, Decreased weight shift to right, Decreased step length - left ?Gait velocity: decr ?  ?  ?General Gait Details: patient ambulating well with cues for safety/sequencing ? ? ?Stairs ?  ?  ?  ?  ?  ? ? ?Wheelchair Mobility ?  ? ?Modified Rankin (Stroke Patients Only) ?  ? ? ?  ?Balance Overall balance assessment: Needs assistance ?Sitting-balance support: Feet supported ?Sitting balance-Leahy Scale: Good ?  ?  ?Standing balance support: Bilateral upper extremity supported, During functional activity, Reliant on assistive device for balance ?Standing balance-Leahy Scale: Good ?  ?  ?  ?  ?  ?  ?  ?  ?  ?  ?  ?  ?  ? ?  ?Cognition Arousal/Alertness: Awake/alert ?Behavior During Therapy: [facility] for tasks assessed/performed ?Overall Cognitive Status: Within Functional Limits for tasks assessed ?  ?  ?  ?  ?  ?  ?  ?  ?  ?  ?  ?  ?  ?  ?  ?  ?  ?  ?  ? ?  ?Exercises Total Joint Exercises ?Ankle Circles/Pumps: AROM, Both, 15 reps ?Quad Sets: AROM, Right, 10 reps ?Hip ABduction/ADduction: AROM, Right, 10 reps ?Straight Leg Raises: AROM, Right, 10 reps ?Long Arc Quad: AROM, Right, 10 reps ?Knee Flexion: AROM, Right, 10 reps ?Goniometric ROM: 0-82 ? ?  ?General Comments   ?  ?  ? ?Pertinent Vitals/Pain Pain Assessment ?Pain Assessment: 0-10 ?Pain Score: 4  ?Pain Location: R knee ?Pain Descriptors / Indicators: Discomfort, Sore, Operative site guarding, Burning, Sharp ?Pain Intervention(s): Monitored during session, Premedicated before session, Repositioned, Ice applied  ? ? ?  Home Living   ?  ?  ?  ?  ?  ?  ?  ?  ?  ?   ?  ?Prior Function    ?  ?  ?   ? ?PT Goals (current goals can now be found in the care plan section) Acute Rehab PT Goals ?Patient Stated Goal: to return home tomorrow ?PT Goal Formulation: With patient ?Time For Goal Achievement: 06/05/21 ?Potential to Achieve Goals: Good ?Progress towards PT goals: Progressing toward goals ? ?  ?Frequency ? ? ? BID ? ? ? ?  ?PT Plan Current  plan remains appropriate  ? ? ?Co-evaluation   ?  ?  ?  ?  ? ?  ?AM-PAC PT "6 Clicks" Mobility   ?Outcome Measure ? Help needed turning from your back to your side while in a flat bed without using bedrails?: None ?Help needed moving from lying on your back to sitting on the side of a flat bed without using bedrails?: None ?Help needed moving to and from a bed to a chair (including a wheelchair)?: A Little ?Help needed standing up from a chair using your arms (e.g., wheelchair or bedside chair)?: A Little ?Help needed to walk in hospital room?: A Little ?Help needed climbing 3-5 steps with a railing? : A Little ?6 Click Score: 20 ? ?  ?End of Session Equipment Utilized During Treatment: Gait belt ?Activity Tolerance: Patient tolerated treatment well ?Patient left: in chair;with call bell/phone within reach ?Nurse Communication: Mobility status ?PT Visit Diagnosis: Other abnormalities of gait and mobility (R26.89);Muscle weakness (generalized) (M62.81);Difficulty in walking, not elsewhere classified (R26.2);Pain ?Pain - Right/Left: Right ?Pain - part of body: Knee ?  ? ? ?Time: 5462-7035 ?PT Time Calculation (min) (ACUTE ONLY): 32 min ? ?Charges:  $Gait Training: 8-22 mins ?$Therapeutic Exercise: 8-22 mins          ?          ? ?Amanda Cockayne, PT, GCS ?06/02/21,10:13 AM ? ? ?

## 2021-06-02 NOTE — TOC Progression Note (Signed)
Transition of Care (TOC) - Progression Note  ? ? ?Patient Details  ?Name: Theresa Thomas ?MRN: [id number] ?Date of Birth: 09-09-1960 ? ?Transition of Care (TOC) CM/SW Contact  ?Conception Oms, RN ?Phone Number: ?06/02/2021, 2:00 PM ? ?Clinical Narrative:    ?Patient set up with [facility] for [facility] services, Has a rolling walker at home ? ?Family provides transportation ?  ?  ? ?Expected Discharge Plan and Services ?  ?  ?  ?  ?  ?Expected Discharge Date: 06/02/21               ?  ?  ?  ?  ?  ?  ?  ?  ?  ?  ? ? ?Social Determinants of Health (SDOH) Interventions ?  ? ?Readmission Risk Interventions ?   ? View : No data to display.  ?  ?  ?  ? ? ?

## 2021-06-02 NOTE — Progress Notes (Signed)
Physical Therapy Treatment ?Patient Details ?Name: Theresa Thomas ?MRN: [id number] ?DOB: May 31, 1960 ?Today's Date: 06/02/2021 ? ? ?History of Present Illness Patient is a 61 year old female admitted for R TKA. PMH includes L TKA, hypothyroid, wears Cpap at home ? ?  ?PT Comments  ? ? Patient received in recliner, just finishing lunch. Mother present in room. She is eager to get home. Patient dressing clean and dry. Educated patient in [place] and handout provided. She is mod independent with transfers, ambulation with RW 200' and ambulated up/down 4 steps x2 with supervision. Patient ready to be discharged. She will continue to benefit from skilled PT to improve strength, independence and rom.  ?   ?Recommendations for follow up therapy are one component of a multi-disciplinary discharge planning process, led by the attending physician.  Recommendations may be updated based on patient status, additional functional criteria and insurance authorization. ? ?Follow Up Recommendations ? Home health PT ?  ?  ?Assistance Recommended at Discharge Intermittent Supervision/Assistance  ?Patient can return home with the following A little help with walking and/or transfers;A little help with bathing/dressing/bathroom;Assist for transportation;Help with stairs or ramp for entrance;Assistance with cooking/housework ?  ?Equipment Recommendations ? None recommended by PT  ?  ?Recommendations for Other Services   ? ? ?  ?Precautions / Restrictions Precautions ?Precautions: Fall ?Restrictions ?Weight Bearing Restrictions: Yes ?RLE Weight Bearing: Weight bearing as tolerated  ?  ? ?Mobility ? Bed Mobility ?Overal bed mobility: Modified Independent ?  ?  ?  ?  ?  ?  ?General bed mobility comments: NT ?  ? ?Transfers ?Overall transfer level: Modified independent ?Equipment used: Rolling walker (2 wheels) ?Transfers: Sit to/from Stand ?Sit to Stand: Modified independent (Device/Increase time) ?  ?  ?  ?  ?  ?General transfer comment: cues for  hand placement, safety ?  ? ?Ambulation/Gait ?Ambulation/Gait assistance: Supervision ?Gait Distance (Feet): 200 Feet ?Assistive device: Rolling walker (2 wheels) ?Gait Pattern/deviations: Step-through pattern ?Gait velocity: decr ?  ?  ?General Gait Details: patient ambulating well ? ? ?Stairs ?Stairs: Yes ?Stairs assistance: Supervision ?Stair Management: Two rails, One rail Right, Step to pattern, Forwards, Sideways ?Number of Stairs: 8 ?General stair comments: ambulated up/down 4 steps with B rails forward, then with 1 rail on right sideways. Supervision. ? ? ?Wheelchair Mobility ?  ? ?Modified Rankin (Stroke Patients Only) ?  ? ? ?  ?Balance Overall balance assessment: Modified Independent ?Sitting-balance support: Feet supported ?Sitting balance-Leahy Scale: Normal ?  ?  ?Standing balance support: Bilateral upper extremity supported, During functional activity, Reliant on assistive device for balance ?Standing balance-Leahy Scale: Good ?  ?  ?  ?  ?  ?  ?  ?  ?  ?  ?  ?  ?  ? ?  ?Cognition Arousal/Alertness: Awake/alert ?Behavior During Therapy: [facility] for tasks assessed/performed ?Overall Cognitive Status: Within Functional Limits for tasks assessed ?  ?  ?  ?  ?  ?  ?  ?  ?  ?  ?  ?  ?  ?  ?  ?  ?  ?  ?  ? ?  ?Exercises Total Joint Exercises ?Ankle Circles/Pumps: AROM, Both, 20 reps ?Quad Sets: AROM, Right, 10 reps ?Short Arc Quad: AROM, Right, 10 reps ?Heel Slides: AROM, Right, 10 reps ?Hip ABduction/ADduction: AROM, Right, 10 reps ?Straight Leg Raises: AROM, Right, 10 reps ?Long Arc Quad: AROM, Right, 10 reps ?Knee Flexion: AROM, Right, 10 reps ?Goniometric ROM: 0-82 ? ?  ?  General Comments   ?  ?  ? ?Pertinent Vitals/Pain Pain Assessment ?Pain Assessment: Faces ?Pain Score: 4  ?Faces Pain Scale: Hurts a little bit ?Pain Location: R knee ?Pain Descriptors / Indicators: Discomfort, Tightness ?Pain Intervention(s): Monitored during session, Premedicated before session, Repositioned, Ice applied, Limited activity  within patient's tolerance  ? ? ?Home Living Family/patient expects to be discharged to:: Private residence ?Living Arrangements: Alone ?Available Help at Discharge: Family;Available PRN/intermittently ?Type of Home: House ?Home Access: Stairs to enter ?Entrance Stairs-Rails: Right ?Entrance Stairs-Number of Steps: 3 ?  ?Home Layout: One level ?Home Equipment: Conservation officer, nature (2 wheels);Other (comment) ?   ?  ?Prior Function    ?  ?  ?   ? ?PT Goals (current goals can now be found in the care plan section) Acute Rehab PT Goals ?Patient Stated Goal: to go home ?PT Goal Formulation: With patient ?Time For Goal Achievement: 06/05/21 ?Potential to Achieve Goals: Good ?Progress towards PT goals: Progressing toward goals ? ?  ?Frequency ? ? ? BID ? ? ? ?  ?PT Plan Current plan remains appropriate  ? ? ?Co-evaluation   ?  ?  ?  ?  ? ?  ?AM-PAC PT "6 Clicks" Mobility   ?Outcome Measure ? Help needed turning from your back to your side while in a flat bed without using bedrails?: None ?Help needed moving from lying on your back to sitting on the side of a flat bed without using bedrails?: None ?Help needed moving to and from a bed to a chair (including a wheelchair)?: None ?Help needed standing up from a chair using your arms (e.g., wheelchair or bedside chair)?: None ?Help needed to walk in hospital room?: A Little ?Help needed climbing 3-5 steps with a railing? : A Little ?6 Click Score: 22 ? ?  ?End of Session Equipment Utilized During Treatment: Gait belt ?Activity Tolerance: Patient tolerated treatment well ?Patient left: in chair;with call bell/phone within reach;with family/visitor present ?Nurse Communication: Mobility status ?PT Visit Diagnosis: Muscle weakness (generalized) (M62.81);Pain;Difficulty in walking, not elsewhere classified (R26.2) ?Pain - Right/Left: Right ?Pain - part of body: Knee ?  ? ? ?Time: 6808-8110 ?PT Time Calculation (min) (ACUTE ONLY): 26 min ? ?Charges:  $Gait Training: 8-22  mins ?$Therapeutic Exercise: 8-22 mins          ?          ? ?Amanda Cockayne, PT, GCS ?06/02/21,1:44 PM ? ?

## 2021-06-02 NOTE — Evaluation (Signed)
Occupational Therapy Evaluation ?Patient Details ?Name: Theresa Thomas ?MRN: [id number] ?DOB: 05-16-1960 ?Today's Date: 06/02/2021 ? ? ?History of Present Illness Patient is a 61 year old female admitted for R TKA. PMH includes L TKA, hypothyroid, wears Cpap at home  ? ?Clinical Impression ?  ?Upon entering the room, pt seated in recliner chair and having recently finished working with PT and reports 4/10 pain in R knee. Pt is agreeable to OT intervention. OT educated pt on use of polar care and how to increase independence with LB self care. Pt is unable to reach down to don/doff clothing on R LE without assistance but plans on mother assisting until she is able. Pt has all needed equipment for home. No further OT needs. OT to sign off.  ?   ? ?Recommendations for follow up therapy are one component of a multi-disciplinary discharge planning process, led by the attending physician.  Recommendations may be updated based on patient status, additional functional criteria and insurance authorization.  ? ?Follow Up Recommendations ? No OT follow up  ?  ?Assistance Recommended at Discharge Intermittent Supervision/Assistance  ?Patient can return home with the following A little help with walking and/or transfers;A little help with bathing/dressing/bathroom;Help with stairs or ramp for entrance;Assist for transportation ? ?  ?   ?Equipment Recommendations ? None recommended by OT  ?  ?   ?Precautions / Restrictions Precautions ?Precautions: Fall ?Restrictions ?Weight Bearing Restrictions: Yes ?RLE Weight Bearing: Weight bearing as tolerated  ? ?  ? ?Mobility Bed Mobility ?  ?  ?  ?  ?  ?  ?  ?General bed mobility comments: seated in recliner chair ?  ? ?Transfers ?Overall transfer level: Needs assistance ?Equipment used: Rolling walker (2 wheels) ?Transfers: Sit to/from Stand ?Sit to Stand: Min guard ?  ?  ?  ?  ?  ?  ?  ? ?  ?Balance Overall balance assessment: Needs assistance ?Sitting-balance support: Feet  supported ?Sitting balance-Leahy Scale: Good ?  ?  ?Standing balance support: Bilateral upper extremity supported, During functional activity, Reliant on assistive device for balance ?Standing balance-Leahy Scale: Good ?  ?  ?  ?  ?  ?  ?  ?  ?  ?  ?  ?  ?   ? ?ADL either performed or assessed with clinical judgement  ? ?ADL Overall ADL's : Needs assistance/impaired ?  ?  ?  ?  ?  ?  ?  ?  ?  ?  ?  ?  ?  ?  ?  ?  ?  ?  ?  ?General ADL Comments: min guard for functional transfers and min A for LB dressing.  ? ? ? ?Vision Patient Visual Report: No change from baseline ?   ?   ?   ?   ? ?Pertinent Vitals/Pain Pain Assessment ?Pain Assessment: 0-10 ?Pain Score: 4  ?Pain Location: R knee ?Pain Descriptors / Indicators: Discomfort, Sore, Operative site guarding, Burning ?Pain Intervention(s): Limited activity within patient's tolerance, Monitored during session, Repositioned, Ice applied  ? ? ? ?Hand Dominance Right ?  ?Extremity/Trunk Assessment Upper Extremity Assessment ?Upper Extremity Assessment: Overall WFL for tasks assessed ?  ?Lower Extremity Assessment ?Lower Extremity Assessment: RLE deficits/detail ?RLE Deficits / Details: painful, appropriate for post surgical ?  ?  ?  ?Communication Communication ?Communication: No difficulties ?  ?Cognition Arousal/Alertness: Awake/alert ?Behavior During Therapy: [facility] for tasks assessed/performed ?Overall Cognitive Status: Within Functional Limits for tasks assessed ?  ?  ?  ?  ?  ?  ?  ?  ?  ?  ?  ?  ?  ?  ?  ?  ?  ?  ?  ?   ?   ?   ? ? ?  Home Living Family/patient expects to be discharged to:: Private residence ?Living Arrangements: Alone ?Available Help at Discharge: Family;Available PRN/intermittently ?Type of Home: House ?Home Access: Stairs to enter ?Entrance Stairs-Number of Steps: 3 ?Entrance Stairs-Rails: Right ?Home Layout: One level ?  ?  ?Bathroom Shower/Tub: Tub/shower unit ?  ?Bathroom Toilet: Standard ?  ?  ?Home Equipment: Conservation officer, nature (2 wheels);Other  (comment) ?  ?  ?  ? ?  ?Prior Functioning/Environment Prior Level of Function : Independent/Modified Independent;Driving ?  ?  ?  ?  ?  ?  ?Mobility Comments: patient reports she was not using AD prior, but not walking well. Retired ?ADLs Comments: independent ?  ? ?  ?  ?   ?   ?   ?OT Goals(Current goals can be found in the care plan section) Acute Rehab OT Goals ?Patient Stated Goal: to go home ?OT Goal Formulation: With patient ?Time For Goal Achievement: 06/16/21 ?Potential to Achieve Goals: Good  ?OT Frequency:   ?  ? ?   ?AM-PAC OT "6 Clicks" Daily Activity     ?Outcome Measure Help from another person eating meals?: None ?Help from another person taking care of personal grooming?: None ?Help from another person toileting, which includes using toliet, bedpan, or urinal?: A Little ?Help from another person bathing (including washing, rinsing, drying)?: A Little ?Help from another person to put on and taking off regular upper body clothing?: None ?Help from another person to put on and taking off regular lower body clothing?: A Little ?6 Click Score: 21 ?  ?End of Session Equipment Utilized During Treatment: Rolling walker (2 wheels) ?Nurse Communication: Mobility status ? ?Activity Tolerance: Patient tolerated treatment well ?Patient left: in bed;with call bell/phone within reach ? ?   ?              ?Time: 6222-9798 ?OT Time Calculation (min): 19 min ?Charges:  OT General Charges ?$OT Visit: 1 Visit ?OT Evaluation ?$OT Eval Low Complexity: 1 Low ?OT Treatments ?$Self Care/Home Management : 8-22 mins ? ?Darleen Crocker, MS, OTR/L , CBIS ?ascom [phone]  ?06/02/21, 1:05 PM  ?

## 2021-06-02 NOTE — Progress Notes (Signed)
?  Subjective: ?1 Day Post-Op Procedure(s) (LRB): ?COMPUTER ASSISTED TOTAL KNEE ARTHROPLASTY (Right) ?Patient reports pain as moderate.   ?Patient is well, and has had no acute complaints or problems ?Plan is to go Home after hospital stay. ?Negative for chest pain and shortness of breath ?Fever: no ?Gastrointestinal: negative for nausea and vomiting.  Patient has not had a bowel movement. ? ?Objective: ?Vital signs in last 24 hours: ?Temp:  [97.5 ?F (36.4 ?C)-98.2 ?F (36.8 ?C)] 97.5 ?F (36.4 ?C) (04/27 0981) ?Pulse Rate:  [50-66] 66 (04/27 0946) ?Resp:  [12-26] 16 (04/27 0758) ?BP: (93-117)/(50-77) 117/55 (04/27 0946) ?SpO2:  [93 %-100 %] 100 % (04/27 0758) ? ?Intake/Output from previous day: ? ?Intake/Output Summary (Last 24 hours) at 06/02/2021 1152 ?Last data filed at 06/02/2021 1030 ?Gross per 24 hour  ?Intake 360 ml  ?Output 340 ml  ?Net 20 ml  ?  ?Intake/Output this shift: ?Total I/O ?In: 240 [P.O.:240] ?Out: -  ? ?Labs: ?No results for input(s): HGB in the last 72 hours. ?No results for input(s): WBC, RBC, HCT, PLT in the last 72 hours. ?No results for input(s): NA, K, CL, CO2, BUN, CREATININE, GLUCOSE, CALCIUM in the last 72 hours. ?No results for input(s): LABPT, INR in the last 72 hours. ? ? ?EXAM ?General - Patient is Alert, Appropriate, and Oriented ?Extremity - Neurovascular intact ?Dorsiflexion/Plantar flexion intact ?Compartment soft ?Dressing/Incision -Postoperative dressing remains in place., Polar Care in place and working. , Hemovac in place.  ?Motor Function - intact, moving foot and toes well on exam. Able to perform independent SLR.  ?Cardiovascular- Regular rate and rhythm, no murmurs/rubs/gallops ?Respiratory- Lungs clear to auscultation bilaterally ?Gastrointestinal- soft, nontender, and hypoactive bowel sounds ? ? ?Assessment/Plan: ?1 Day Post-Op Procedure(s) (LRB): ?COMPUTER ASSISTED TOTAL KNEE ARTHROPLASTY (Right) ?Principal Problem: ?  Total knee replacement status ? ?Estimated body mass  index is 40.29 kg/m? as calculated from the following: ?  Height as of this encounter: '5\' 8"'$  (1.727 m). ?  Weight as of this encounter: 120.2 kg. ?Advance diet ?Up with therapy ? ? ?Possible d/c this PM pending completion of therapy goals.  ? ?DVT Prophylaxis - Lovenox, Ted hose, and SCDs ?Weight-Bearing as tolerated to right leg ? ?Cassell Smiles, PA-C ?[facility] ?06/02/2021, 11:52 AM ? ?

## 2021-07-27 ENCOUNTER — Ambulatory Visit: Payer: BC Managed Care – PPO | Admitting: Dermatology

## 2021-11-21 ENCOUNTER — Ambulatory Visit: Payer: BC Managed Care – PPO | Admitting: Dermatology

## 2021-12-23 ENCOUNTER — Other Ambulatory Visit: Payer: Self-pay | Admitting: Orthopedic Surgery

## 2021-12-23 DIAGNOSIS — M4807 Spinal stenosis, lumbosacral region: Secondary | ICD-10-CM

## 2021-12-23 DIAGNOSIS — G8929 Other chronic pain: Secondary | ICD-10-CM

## 2022-01-16 ENCOUNTER — Ambulatory Visit
Admission: RE | Admit: 2022-01-16 | Discharge: 2022-01-16 | Disposition: A | Discharge status: Home / Self Care | Payer: BC Managed Care – PPO | Source: Ambulatory Visit | Attending: Orthopedic Surgery | Admitting: Orthopedic Surgery

## 2022-01-16 DIAGNOSIS — G8929 Other chronic pain: Secondary | ICD-10-CM

## 2022-01-16 DIAGNOSIS — M4807 Spinal stenosis, lumbosacral region: Secondary | ICD-10-CM

## 2022-03-21 ENCOUNTER — Other Ambulatory Visit: Payer: Self-pay | Admitting: Family Medicine

## 2022-03-21 DIAGNOSIS — K824 Cholesterolosis of gallbladder: Secondary | ICD-10-CM

## 2022-03-22 ENCOUNTER — Other Ambulatory Visit: Payer: Self-pay | Admitting: Family Medicine

## 2022-03-22 DIAGNOSIS — Z13820 Encounter for screening for osteoporosis: Secondary | ICD-10-CM

## 2022-03-22 DIAGNOSIS — Z1231 Encounter for screening mammogram for malignant neoplasm of breast: Secondary | ICD-10-CM

## 2022-03-23 ENCOUNTER — Ambulatory Visit
Admission: RE | Admit: 2022-03-23 | Discharge: 2022-03-23 | Disposition: A | Discharge status: Home / Self Care | Payer: BC Managed Care – PPO | Source: Ambulatory Visit | Attending: Family Medicine | Admitting: Family Medicine

## 2022-03-23 DIAGNOSIS — K824 Cholesterolosis of gallbladder: Secondary | ICD-10-CM | POA: Diagnosis present

## 2022-06-07 ENCOUNTER — Ambulatory Visit
Admission: RE | Admit: 2022-06-07 | Discharge: 2022-06-07 | Disposition: A | Discharge status: Home / Self Care | Payer: BC Managed Care – PPO | Source: Ambulatory Visit | Attending: Family Medicine | Admitting: Family Medicine

## 2022-06-07 DIAGNOSIS — Z1231 Encounter for screening mammogram for malignant neoplasm of breast: Secondary | ICD-10-CM | POA: Insufficient documentation

## 2022-09-07 ENCOUNTER — Ambulatory Visit: Payer: Self-pay | Admitting: Urology

## 2022-09-07 ENCOUNTER — Encounter: Payer: Self-pay | Admitting: Urology

## 2022-09-07 VITALS — BP 119/78 | HR 59 | Ht 68.0 in | Wt 262.0 lb

## 2022-09-07 DIAGNOSIS — R319 Hematuria, unspecified: Secondary | ICD-10-CM

## 2022-09-07 DIAGNOSIS — R310 Gross hematuria: Secondary | ICD-10-CM

## 2022-09-07 LAB — MICROSCOPIC EXAMINATION

## 2022-09-07 LAB — URINALYSIS, COMPLETE
Bilirubin, UA: NEGATIVE
Glucose, UA: NEGATIVE
Ketones, UA: NEGATIVE
Nitrite, UA: NEGATIVE
Protein,UA: NEGATIVE
RBC, UA: NEGATIVE
Specific Gravity, UA: 1.025 (ref 1.005–1.030)
Urobilinogen, Ur: 0.2 mg/dL (ref 0.2–1.0)
pH, UA: 6 (ref 5.0–7.5)

## 2022-09-07 NOTE — Progress Notes (Signed)
I,Dina M Abdulla,acting as a scribe for Riki Altes, MD.,have documented all relevant documentation on the behalf of Riki Altes, MD,as directed by  Riki Altes, MD while in the presence of Riki Altes, MD.  09/07/2022 2:01 PM   Theresa Thomas [birth year] [id number]  Referring provider: Dorothey Baseman, MD [phone] [place],  [place] [address]  Chief Complaint  Patient presents with   New Patient (Initial Visit)    HPI: Theresa Thomas is a 62 y.o. female referred for evaluation of hematuria.  Urinalyses February, March, and April 2024 showed microhematuria at levels of 11 RBCs, 10-50 RBCs, and 9 RBCs/6 WBCs respectively. Urine cultures have shown no significant growth. She has been asymptomatic and denies dysuria, frequency, urgency, or flank/abdominal/pelvic pain. She has had 2 episodes of gross hematuria, the last on Memorial Day of this year. She does have a prior history of stone disease and has passed several stones, but never required operative intervention. No complaints today.   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     Surgical History: Past Surgical History:  Procedure Laterality Date   COLONOSCOPY WITH PROPOFOL N/A 10/26/2014   Procedure: COLONOSCOPY WITH PROPOFOL;  Surgeon: Scot Jun, MD;  Location[facility] ENDOSCOPY;  Service: Endoscopy;  Laterality: N/A;   COLONOSCOPY WITH PROPOFOL N/A 02/24/2021   Procedure: COLONOSCOPY WITH PROPOFOL;  Surgeon: Midge Minium, MD;  Location: [facility] ENDOSCOPY;  Service: Endoscopy;  Laterality: N/A;   KNEE ARTHROPLASTY Right 06/01/2021   Procedure: COMPUTER ASSISTED TOTAL KNEE ARTHROPLASTY;  Surgeon: Donato Heinz, MD;  Location: [facility] ORS;  Service: Orthopedics;   Laterality: Right;   KNEE ARTHROSCOPY Left 01/20/2016   Procedure: ARTHROSCOPY KNEE, PARTIAL MEDIAL MENISCECTOMY, CHONDROPLASTY;  Surgeon: Erin Sons, MD;  Location: [facility] ORS;  Service: Orthopedics;  Laterality: Left;   REPLACEMENT TOTAL KNEE Left 2019    Home Medications:  Allergies as of 09/07/2022       Reactions   Tape Rash   Cortisone Rash   Had a rash that lasted 3 weeks. Was given with marcaine and xylocaine. Not sure which of the three she had a reaction to.   Ethyl Chloride Rash   Gets a long acting rash from spray that last up to a month   Lidocaine Hcl Rash        Medication List        Accurate as of September 07, 2022  2:01 PM. If you have any questions, ask your nurse or doctor.          STOP taking these medications    celecoxib 200 MG capsule Commonly known as: CELEBREX Stopped by: Riki Altes       TAKE these medications    acetaminophen 500 MG tablet Commonly known as: TYLENOL Take 1,000 mg by mouth every 6 (six) hours as needed for moderate pain or headache.   aspirin-acetaminophen-caffeine 250-250-65 MG tablet Commonly known as: EXCEDRIN MIGRAINE Take 3 tablets by mouth 2 (two) times daily as needed for headache.   atenolol 50 MG tablet Commonly known as: TENORMIN Take 25 mg by mouth daily.   atorvastatin 20 MG tablet  Commonly known as: LIPITOR Take 20 mg by mouth at bedtime.   cetirizine 10 MG tablet Commonly known as: ZYRTEC Take 10 mg by mouth daily as needed for allergies.   diphenhydrAMINE 25 MG tablet Commonly known as: BENADRYL Take 25 mg by mouth every 6 (six) hours as needed for allergies or sleep.   docusate sodium 100 MG capsule Commonly known as: Colace Take 1 capsule (100 mg total) by mouth 2 (two) times daily.   enoxaparin 40 MG/0.4ML injection Commonly known as: LOVENOX Inject 0.4 mLs (40 mg total) into the skin daily for 14 days.   fenofibrate 145 MG tablet Commonly known as: TRICOR Take 145 mg by mouth  daily.   furosemide 20 MG tablet Commonly known as: LASIX Take 20 mg by mouth daily.   levothyroxine 100 MCG tablet Commonly known as: SYNTHROID Take 100 mcg by mouth daily before breakfast.   oxyCODONE 5 MG immediate release tablet Commonly known as: Oxy IR/ROXICODONE Take 1 tablet (5 mg total) by mouth every 4 (four) hours as needed for severe pain.   traMADol 50 MG tablet Commonly known as: ULTRAM Take 1 tablet (50 mg total) by mouth every 4 (four) hours as needed for moderate pain.        Allergies:  Allergies  Allergen Reactions   Tape Rash   Cortisone Rash    Had a rash that lasted 3 weeks. Was given with marcaine and xylocaine. Not sure which of the three she had a reaction to.   Ethyl Chloride Rash    Gets a long acting rash from spray that last up to a month   Lidocaine Hcl Rash    Family History: Family History  Problem Relation Age of Onset   Breast cancer Mother 33   Kidney cancer Neg Hx    Bladder Cancer Neg Hx     Social History:  reports that she has never smoked. She has never used smokeless tobacco. She reports current alcohol use of about 2.0 standard drinks of alcohol per week. She reports that she does not use drugs.   Physical Exam: BP 119/78   Pulse (!) 59   Ht 5\' 8"  (1.727 m)   Wt 262 lb (118.8 kg)   BMI 39.84 kg/m   Constitutional:  Alert and oriented, No acute distress. HEENT: [place] AT Respiratory: Normal respiratory effort, no increased work of breathing. GI: Abdomen is soft, nontender, nondistended, no abdominal masses Psychiatric: Normal mood and affect.  Laboratory Data: Urinalysis 6-10 WBC     Assessment & Plan:    1.  Gross hematuria AUA risk stratification: High We discussed the recommended evaluation of high risk hematuria which consist of CT urogram and cystoscopy.  The procedures were discussed in detail and he/she has elected to proceed with further evaluation All questions were answered CTU order placed and  cystoscopy was scheduled Episodes of gross hematuria x2 with microhematuria She requested an anxiolytic and Rx Valium sent to pharmacy. She was informed she would need a driver if utilizing this medication at the time of cystoscopy  I have reviewed the above documentation for accuracy and completeness, and I agree with the above.   Riki Altes, MD  [facility] [address], [address] [place], [place] [address] [phone]

## 2022-09-07 NOTE — Patient Instructions (Addendum)
Cystoscopy Cystoscopy is a procedure that is used to help diagnose and sometimes treat conditions that affect the lower urinary tract. The lower urinary tract includes the bladder and the urethra. The urethra is the tube that drains urine from the bladder. Cystoscopy is done using a thin, tube-shaped instrument with a light and camera at the end (cystoscope). The cystoscope may be hard or flexible, depending on the goal of the procedure. The cystoscope is inserted through the urethra, into the bladder. Cystoscopy may be recommended if you have: Urinary tract infections that keep coming back. Blood in the urine (hematuria). An inability to control when you urinate (urinary incontinence) or an overactive bladder. Unusual cells found in a urine sample. A blockage in the urethra, such as a urinary stone. Painful urination. An abnormality in the bladder found during an intravenous pyelogram (IVP) or CT scan. Cystoscopy may also be done to remove a sample of tissue to be examined under a microscope (biopsy). Tell a health care provider about: Any allergies you have. All medicines you are taking, including vitamins, herbs, eye drops, creams, and over-the-counter medicines. Any problems you or family members have had with anesthetic medicines. Any blood disorders you have. Any surgeries you have had. Any medical conditions you have. Whether you are pregnant or may be pregnant. What are the risks? Generally, this is a safe procedure. However, problems may occur, including: Infection. Bleeding. Allergic reactions to medicines. Damage to other structures or organs. What happens before the procedure? Medicines Ask your health care provider about: Changing or stopping your regular medicines. This is especially important if you are taking diabetes medicines or blood thinners. Taking medicines such as aspirin and ibuprofen. These medicines can thin your blood. Do not take these medicines unless your  health care provider tells you to take them. Taking over-the-counter medicines, vitamins, herbs, and supplements. Tests You may have an exam or testing, such as: X-rays of the bladder, urethra, or kidneys. CT scan of the abdomen or pelvis. Urine tests to check for signs of infection. General instructions Follow instructions from your health care provider about eating or drinking restrictions. Ask your health care provider what steps will be taken to help prevent infection. These steps may include: Washing skin with a germ-killing soap. Taking antibiotic medicine. Plan to have a responsible adult take you home from the hospital or clinic. What happens during the procedure?  You will be given one or more of the following: A medicine to help you relax (sedative). A medicine to numb the area (local anesthetic). The area around the opening of your urethra will be cleaned. The cystoscope will be passed through your urethra into your bladder. Germ-free (sterile) fluid will flow through the cystoscope to fill your bladder. The fluid will stretch your bladder so that your health care provider can clearly examine your bladder walls. Your doctor will look at the urethra and bladder. Your doctor may take a biopsy or remove stones. The cystoscope will be removed, and your bladder will be emptied. The procedure may vary among health care providers and hospitals. What can I expect after the procedure? After the procedure, it is common to have: Some soreness or pain in your abdomen and urethra. Urinary symptoms. These include: Mild pain or burning when you urinate. Pain should stop within a few minutes after you urinate. This may last for up to 1 week. A small amount of blood in your urine for several days. Feeling like you need to urinate but producing only  a small amount of urine. Follow these instructions at home: Medicines Take over-the-counter and prescription medicines only as told by your  health care provider. If you were prescribed an antibiotic medicine, take it as told by your health care provider. Do not stop taking the antibiotic even if you start to feel better. General instructions Return to your normal activities as told by your health care provider. Ask your health care provider what activities are safe for you. If you were given a sedative during the procedure, it can affect you for several hours. Do not drive or operate machinery until your health care provider says that it is safe. Watch for any blood in your urine. If the amount of blood in your urine increases, call your health care provider. Follow instructions from your health care provider about eating or drinking restrictions. If a tissue sample was removed for testing (biopsy) during your procedure, it is up to you to get your test results. Ask your health care provider, or the department that is doing the test, when your results will be ready. Drink enough fluid to keep your urine pale yellow. Keep all follow-up visits. This is important. Contact a health care provider if: You have pain that gets worse or does not get better with medicine, especially pain when you urinate. You have trouble urinating. You have more blood in your urine. Get help right away if: You have blood clots in your urine. You have abdominal pain. You have a fever or chills. You are unable to urinate. Summary Cystoscopy is a procedure that is used to help diagnose and sometimes treat conditions that affect the lower urinary tract. Cystoscopy is done using a thin, tube-shaped instrument with a light and camera at the end. After the procedure, it is common to have some soreness or pain in your abdomen and urethra. Watch for any blood in your urine. If the amount of blood in your urine increases, call your health care provider. If you were prescribed an antibiotic medicine, take it as told by your health care provider. Do not stop taking  the antibiotic even if you start to feel better. This information is not intended to replace advice given to you by your health care provider. Make sure you discuss any questions you have with your health care provider. Document Revised: 10/06/2020 Document Reviewed: 09/05/2019 Elsevier Patient Education  2024 [employer].

## 2022-09-09 ENCOUNTER — Encounter: Payer: Self-pay | Admitting: Urology

## 2022-09-09 MED ORDER — DIAZEPAM 2 MG PO TABS: ORAL_TABLET | ORAL | 0 refills | Status: DC

## 2022-09-13 ENCOUNTER — Ambulatory Visit
Admission: RE | Admit: 2022-09-13 | Discharge: 2022-09-13 | Disposition: A | Discharge status: Home / Self Care | Payer: BC Managed Care – PPO | Source: Ambulatory Visit | Attending: Urology | Admitting: Urology

## 2022-09-13 DIAGNOSIS — R310 Gross hematuria: Secondary | ICD-10-CM | POA: Diagnosis not present

## 2022-09-13 MED ORDER — IOHEXOL 300 MG/ML  SOLN
100.0000 mL | Freq: Once | INTRAMUSCULAR | Status: AC | PRN
  Administered 2022-09-13: 100 mL via INTRAVENOUS

## 2022-09-20 ENCOUNTER — Encounter: Payer: Self-pay | Admitting: Urology

## 2022-09-20 ENCOUNTER — Ambulatory Visit (INDEPENDENT_AMBULATORY_CARE_PROVIDER_SITE_OTHER): Payer: Self-pay | Admitting: Urology

## 2022-09-20 VITALS — BP 152/92 | HR 75 | Ht 68.0 in | Wt 262.0 lb

## 2022-09-20 DIAGNOSIS — R310 Gross hematuria: Secondary | ICD-10-CM

## 2022-09-20 DIAGNOSIS — N200 Calculus of kidney: Secondary | ICD-10-CM

## 2022-09-20 LAB — URINALYSIS, COMPLETE
Bilirubin, UA: NEGATIVE
Glucose, UA: NEGATIVE
Ketones, UA: NEGATIVE
Nitrite, UA: NEGATIVE
Protein,UA: NEGATIVE
Specific Gravity, UA: 1.025 (ref 1.005–1.030)
Urobilinogen, Ur: 0.2 mg/dL (ref 0.2–1.0)
pH, UA: 5.5 (ref 5.0–7.5)

## 2022-09-20 LAB — MICROSCOPIC EXAMINATION: RBC, Urine: >30 /hpf — AB (ref 0–2)

## 2022-09-20 NOTE — Progress Notes (Unsigned)
   09/20/22  CC: No chief complaint on file.   HPI: Refer to my previous note of 09/07/2022.  CTU performed 09/13/2022 showed a 4 mm left UPJ calculus with mild left hydronephrosis.  Small bilateral nonobstructing renal calculi.  Today >30 RBC/11-30 WBC  Blood pressure (!) 152/92, pulse 75, height 5\' 8"  (1.727 m), weight 262 lb (118.8 kg). NED. A&Ox3.   No respiratory distress   Abd soft, NT, ND Normal external genitalia with patent urethral meatus  Cystoscopy Procedure Note  Patient identification was confirmed, informed consent was obtained, and patient was prepped using Betadine solution.  Lidocaine jelly was administered per urethral meatus.    Procedure: - Flexible cystoscope introduced, without any difficulty.   - Thorough search of the bladder revealed:    normal urethral meatus    normal urothelium    no stones    no ulcers     no tumors    no urethral polyps    no trabeculation  - Ureteral orifices were normal in position and appearance.  Post-Procedure: - Patient tolerated the procedure well  Assessment/ Plan: No abnormalities on cystoscopy 4 mm left UPJ calculus as above KUB ordered; if calculus visualized can follow-up for trial of passage versus SWL.  Will call with results Urine culture ordered    Riki Altes, MD

## 2022-09-21 ENCOUNTER — Ambulatory Visit
Admission: RE | Admit: 2022-09-21 | Discharge: 2022-09-21 | Disposition: A | Discharge status: Home / Self Care | Payer: BC Managed Care – PPO | Attending: Urology | Admitting: Urology

## 2022-09-21 ENCOUNTER — Ambulatory Visit
Admission: RE | Admit: 2022-09-21 | Discharge: 2022-09-21 | Disposition: A | Discharge status: Home / Self Care | Payer: BC Managed Care – PPO | Source: Ambulatory Visit | Attending: Urology | Admitting: Urology

## 2022-09-21 DIAGNOSIS — R310 Gross hematuria: Secondary | ICD-10-CM | POA: Insufficient documentation

## 2022-09-24 LAB — CULTURE, URINE COMPREHENSIVE

## 2022-12-26 IMAGING — MG MM DIGITAL SCREENING BILAT W/ TOMO AND CAD
8 series · 8 of 24 positions shown · non-contrast
Comparison: Previous exam(s).

CLINICAL DATA: Screening.

EXAM:
DIGITAL SCREENING BILATERAL MAMMOGRAM WITH TOMOSYNTHESIS AND CAD
TECHNIQUE: Bilateral screening digital craniocaudal and mediolateral oblique
mammograms were obtained. Bilateral screening digital breast
tomosynthesis was performed. The images were evaluated with
computer-aided detection.

[R CC synth-2D]
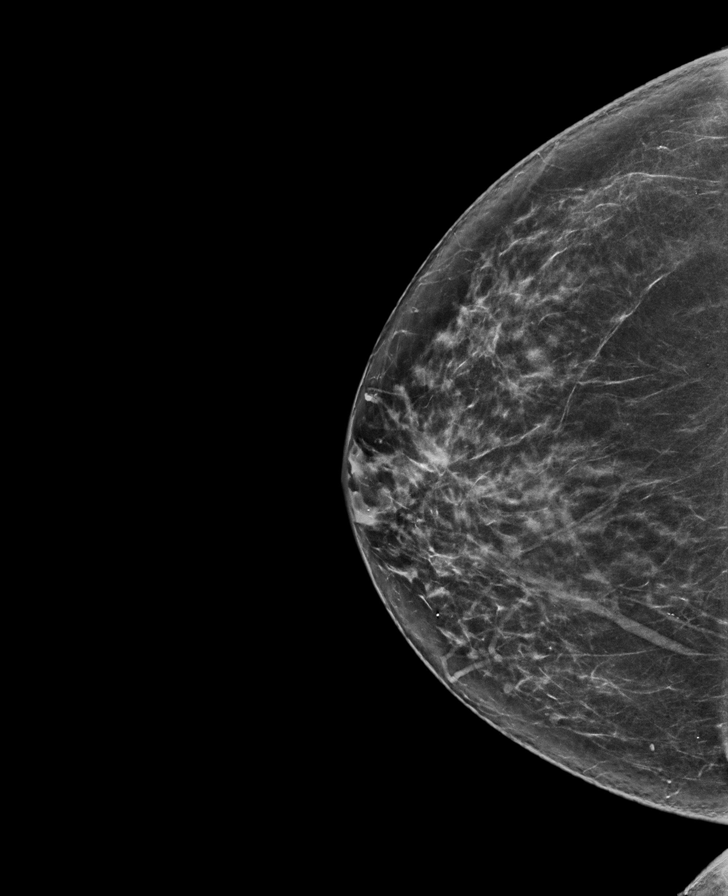

[L MLO synth-2D]
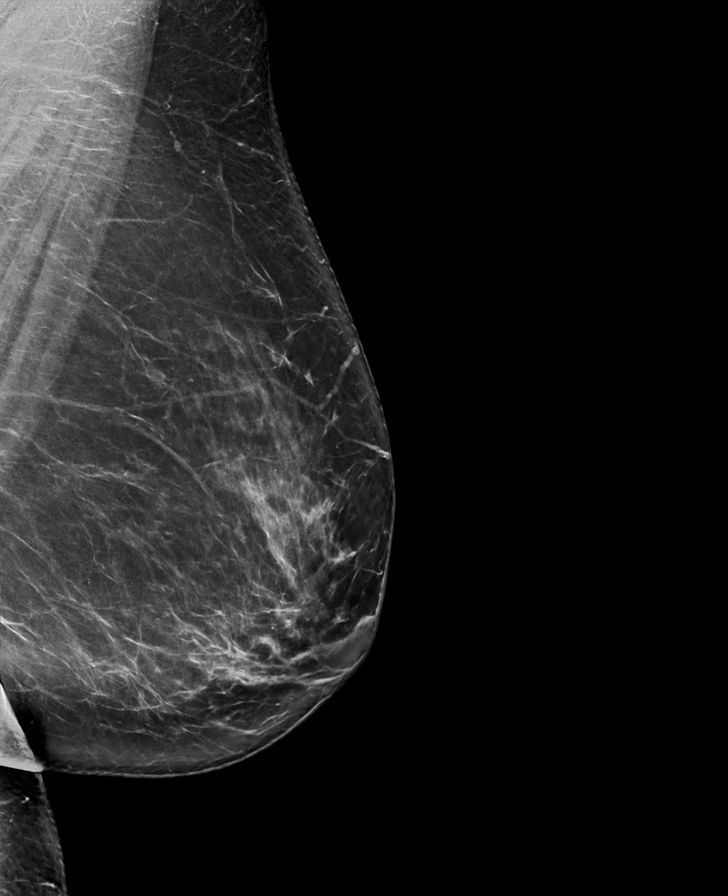

[L CC synth-2D]
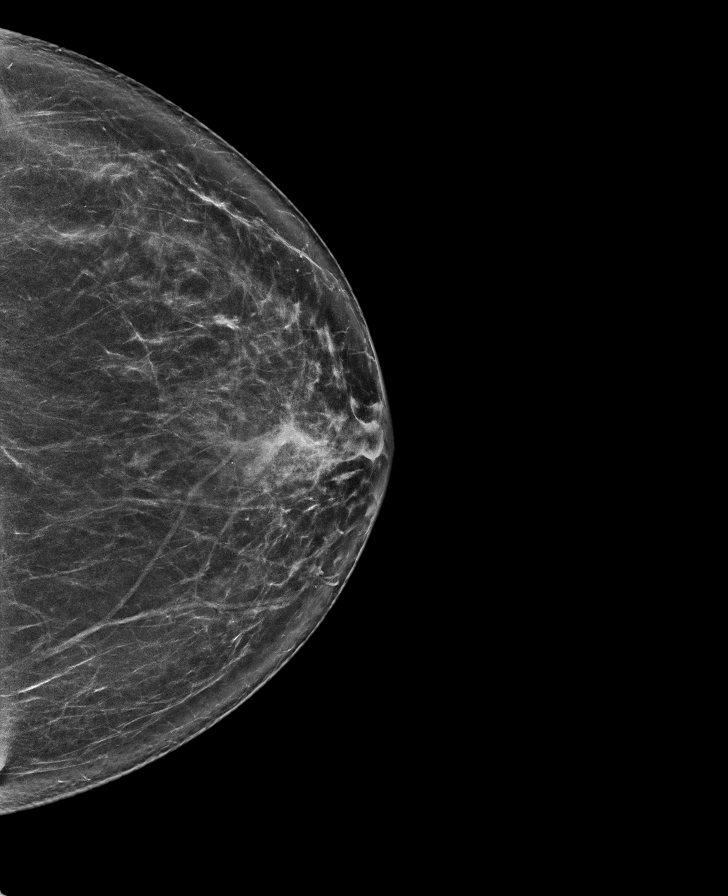

[R MLO synth-2D]
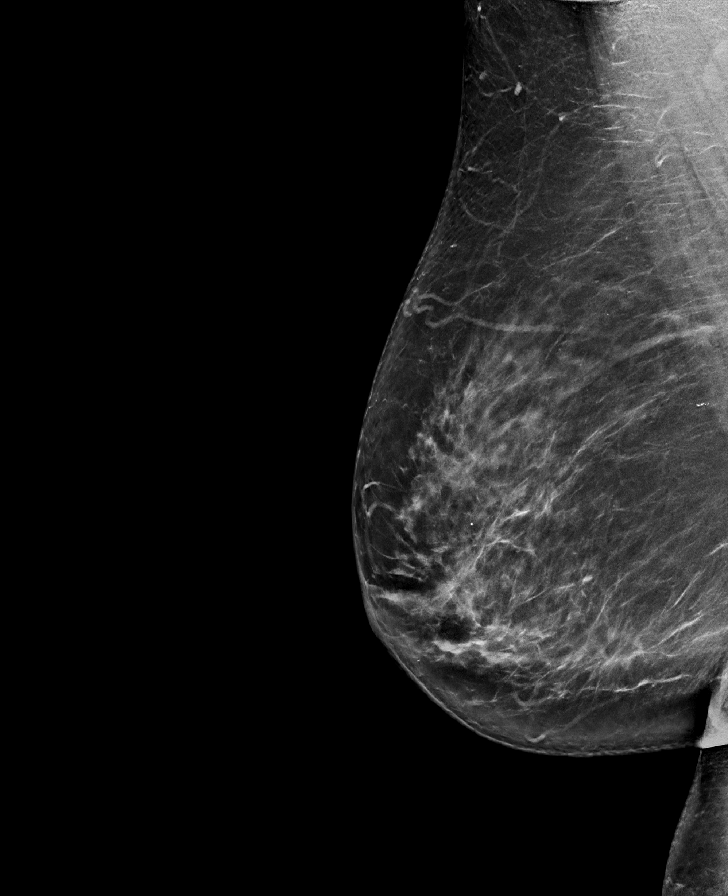

[L MLO tomo · tomo slice 45/90.0]
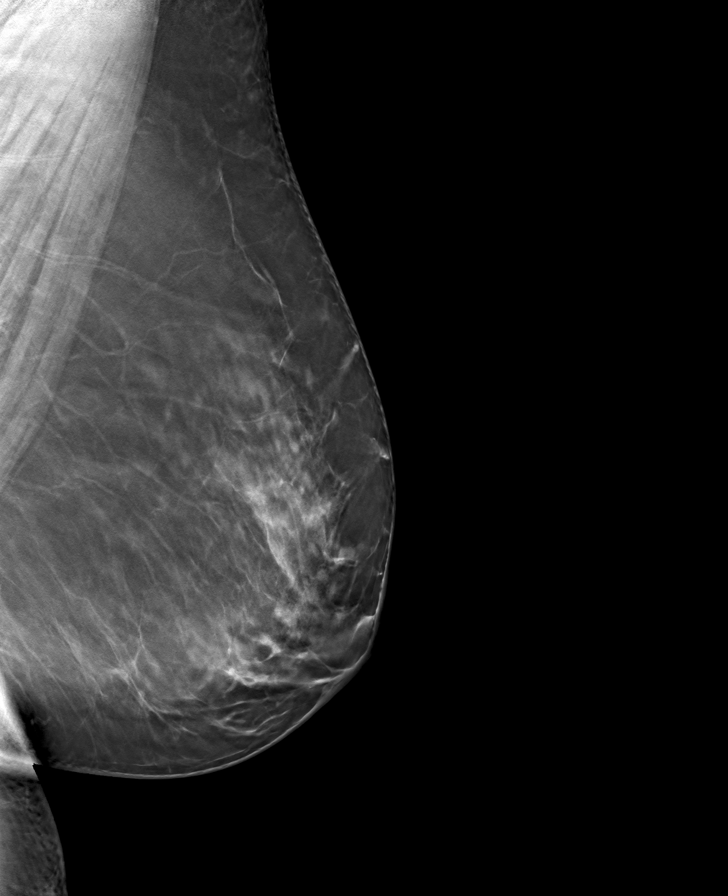

[R CC tomo · tomo slice 39/76.0]
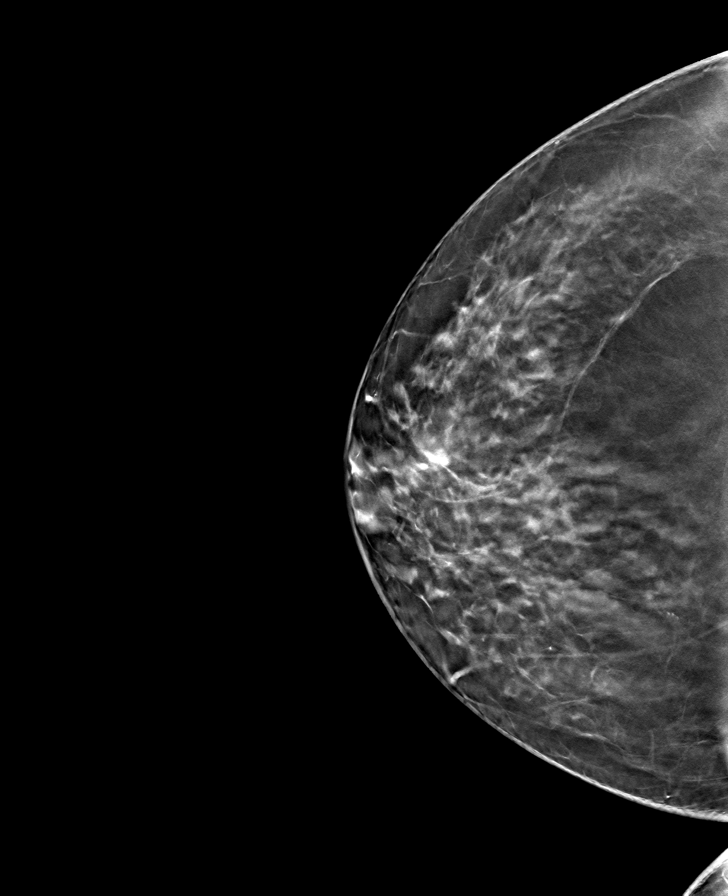

[L CC tomo · tomo slice 39/78.0]
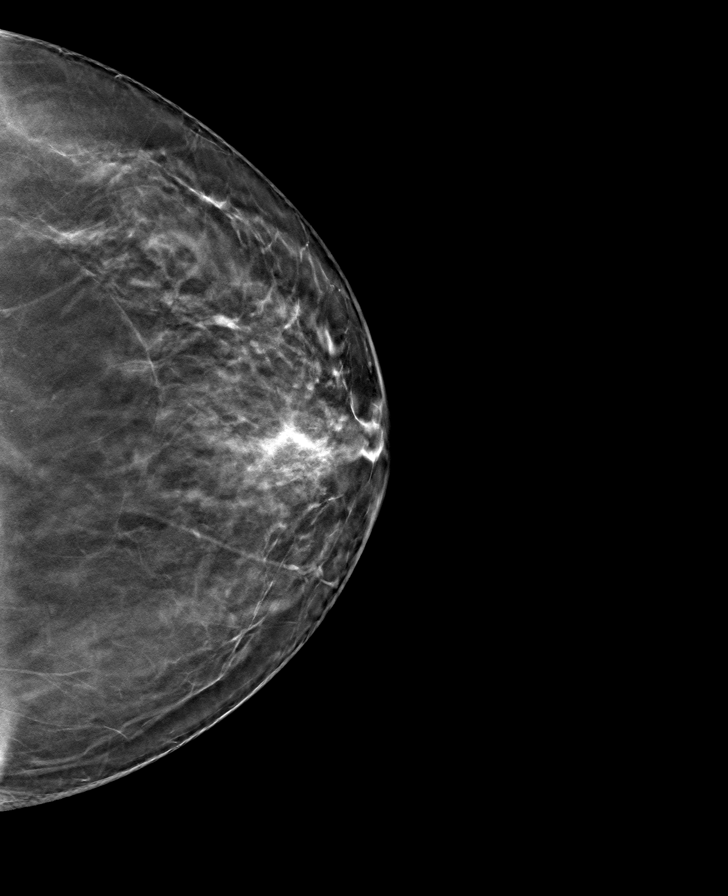

[R MLO tomo · tomo slice 45/90.0]
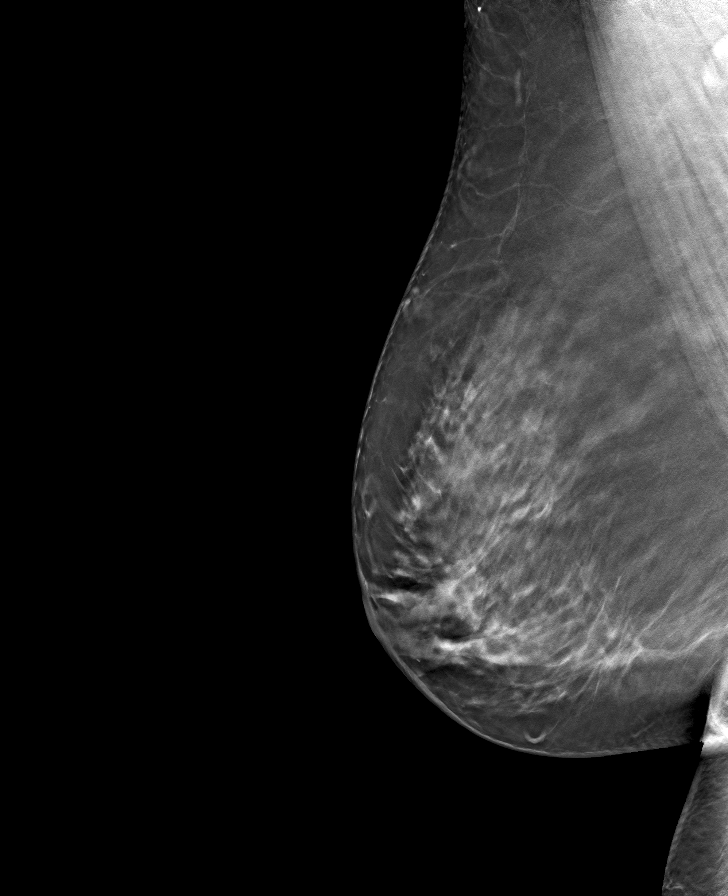

[8 of 24 positions shown; findings below may reference images not displayed]

ACR Breast Density Category b: There are scattered areas of
fibroglandular density.
FINDINGS: There are no findings suspicious for malignancy.
IMPRESSION: No mammographic evidence of malignancy. A result letter of this
screening mammogram will be mailed directly to the patient.

RECOMMENDATION:
Screening mammogram in one year. (Code:51-O-LD2)

BI-RADS CATEGORY  1: Negative.

## 2023-03-29 ENCOUNTER — Ambulatory Visit: Payer: BC Managed Care – PPO | Admitting: Dermatology

## 2023-05-11 ENCOUNTER — Other Ambulatory Visit: Payer: Self-pay | Admitting: Family Medicine

## 2023-05-11 DIAGNOSIS — Z1231 Encounter for screening mammogram for malignant neoplasm of breast: Secondary | ICD-10-CM

## 2023-06-11 ENCOUNTER — Ambulatory Visit
Admission: RE | Admit: 2023-06-11 | Discharge: 2023-06-11 | Disposition: A | Discharge status: Home / Self Care | Payer: Self-pay | Source: Ambulatory Visit | Attending: Family Medicine | Admitting: Family Medicine

## 2023-06-11 DIAGNOSIS — Z1231 Encounter for screening mammogram for malignant neoplasm of breast: Secondary | ICD-10-CM | POA: Diagnosis present

## 2023-07-16 ENCOUNTER — Other Ambulatory Visit: Payer: Self-pay | Admitting: *Deleted

## 2023-07-16 ENCOUNTER — Encounter: Payer: Self-pay | Admitting: Urology

## 2023-07-16 DIAGNOSIS — N200 Calculus of kidney: Secondary | ICD-10-CM

## 2023-09-12 ENCOUNTER — Encounter: Payer: Self-pay | Admitting: Urology

## 2023-09-12 ENCOUNTER — Ambulatory Visit
Admission: RE | Admit: 2023-09-12 | Discharge: 2023-09-12 | Disposition: A | Discharge status: Home / Self Care | Source: Ambulatory Visit | Attending: Urology | Admitting: Urology

## 2023-09-12 ENCOUNTER — Ambulatory Visit: Admitting: Urology

## 2023-09-12 ENCOUNTER — Ambulatory Visit
Admission: RE | Admit: 2023-09-12 | Discharge: 2023-09-12 | Disposition: A | Discharge status: Home / Self Care | Attending: Urology | Admitting: Urology

## 2023-09-12 VITALS — BP 127/79 | HR 59 | Ht 68.0 in | Wt 268.0 lb

## 2023-09-12 DIAGNOSIS — N200 Calculus of kidney: Secondary | ICD-10-CM

## 2023-09-12 NOTE — Progress Notes (Signed)
 09/12/2023 10:18 AM   Theresa Thomas Mar 26, 1960 [id number]  Referring provider: Glover Lenis, MD [phone] [place],  [place] [address]  Chief Complaint  Patient presents with   Follow-up   Urologic history: 1.  Left nephrolithiasis Left renal calculus identified on hematuria evaluation 09/2022  2.  Gross/microhematuria Evaluation August 2024 CT urogram findings as above Cystoscopy unremarkable   HPI: Theresa Thomas is a 63 y.o. female who presents for follow-up visit.  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     Surgical History: Past Surgical History:  Procedure Laterality Date   COLONOSCOPY WITH PROPOFOL  N/A 10/26/2014   Procedure: COLONOSCOPY WITH PROPOFOL ;  Surgeon: Lamar ONEIDA Holmes, MD;  Location: [facility] ENDOSCOPY;  Service: Endoscopy;  Laterality: N/A;   COLONOSCOPY WITH PROPOFOL  N/A 02/24/2021   Procedure: COLONOSCOPY WITH PROPOFOL ;  Surgeon: Jinny Carmine, MD;  Location: [facility] ENDOSCOPY;  Service: Endoscopy;  Laterality: N/A;   KNEE ARTHROPLASTY Right 06/01/2021   Procedure: COMPUTER ASSISTED TOTAL KNEE ARTHROPLASTY;  Surgeon: Mardee Lynwood SQUIBB, MD;  Location: [facility] ORS;  Service: Orthopedics;  Laterality: Right;   KNEE ARTHROSCOPY Left 01/20/2016   Procedure: ARTHROSCOPY KNEE, PARTIAL MEDIAL MENISCECTOMY, CHONDROPLASTY;  Surgeon: Helayne Glenn, MD;  Location: [facility] ORS;  Service: Orthopedics;  Laterality: Left;   REPLACEMENT TOTAL KNEE Left 2019    Home Medications:  Allergies as of 09/12/2023       Reactions   Tape Rash   Cortisone Rash   Had a rash  that lasted 3 weeks. Was given with marcaine  and xylocaine . Not sure which of the three she had a reaction to.   Ethyl Chloride Rash   Gets a long acting rash from spray that last up to a month   Lidocaine  Hcl Rash        Medication List        Accurate as of September 12, 2023 10:18 AM. If you have any questions, ask your nurse or doctor.          STOP taking these medications    aspirin-acetaminophen -caffeine 250-250-65 MG tablet Commonly known as: EXCEDRIN MIGRAINE Stopped by: Dawnna Gritz C Huan Pollok       TAKE these medications    acetaminophen  500 MG tablet Commonly known as: TYLENOL  Take 1,000 mg by mouth every 6 (six) hours as needed for moderate pain or headache.   aspirin 81 MG chewable tablet Chew 1 tablet every day by oral route.   atenolol  50 MG tablet Commonly known as: TENORMIN  Take 25 mg by mouth daily.   atorvastatin  20 MG tablet Commonly known as: LIPITOR Take 20 mg by mouth at bedtime.   cetirizine 10 MG tablet Commonly known as: ZYRTEC Take 10 mg by mouth daily as needed for allergies.   diazepam  2 MG tablet Commonly known as: VALIUM  1 tab po 30 min prior to procedure   diphenhydrAMINE  25 MG tablet Commonly known as: BENADRYL  Take 25 mg by mouth every 6 (six) hours as needed for allergies or sleep.  fenofibrate  145 MG tablet Commonly known as: TRICOR  Take 145 mg by mouth daily.   levothyroxine  100 MCG tablet Commonly known as: SYNTHROID  Take 100 mcg by mouth daily before breakfast.        Allergies:  Allergies  Allergen Reactions   Tape Rash   Cortisone Rash    Had a rash that lasted 3 weeks. Was given with marcaine  and xylocaine . Not sure which of the three she had a reaction to.   Ethyl Chloride Rash    Gets a long acting rash from spray that last up to a month   Lidocaine  Hcl Rash    Family History: Family History  Problem Relation Age of Onset   Breast cancer Mother 12   Kidney cancer Neg Hx    Bladder Cancer Neg Hx      Social History:  reports that she has never smoked. She has never used smokeless tobacco. She reports current alcohol use of about 2.0 standard drinks of alcohol per week. She reports that she does not use drugs.   Physical Exam: BP 127/79   Pulse (!) 59   Ht 5' 8 (1.727 m)   Wt 268 lb (121.6 kg)   BMI 40.75 kg/m   Constitutional:  Alert and oriented, No acute distress. HEENT: [place] AT Respiratory: Normal respiratory effort, no increased work of breathing. Psychiatric: Normal mood and affect.    Pertinent Imaging: KUB performed earlier today was personally reviewed and interpreted.  7 x 10 mm calcification overlying the inferior portion of the left renal outline.  Difficult to tell if the stone is in the proximal ureter or lower pole kidney   Assessment & Plan:    1. Nephrolithiasis  Schedule RUS to better determine stone position and hydronephrosis We discussed various treatment options for urolithiasis including observation with or without medical expulsive therapy, shockwave lithotripsy (SWL), ureteroscopy and laser lithotripsy with stent placement. We discussed that management is based on stone size, location, density, patient co-morbidities, and patient preference.  Stone has been present for ~1 year and she will be unable to pass SWL has a lower stone free rate in a single procedure, but also a lower complication rate compared to ureteroscopy and avoids a stent and associated stent related symptoms. Possible complications include renal hematoma, steinstrasse, and need for additional treatment. Ureteroscopy with laser lithotripsy and stent placement has a higher stone free rate than SWL in a single procedure, however increased complication rate including possible infection, ureteral injury, bleeding, and stent related morbidity. Common stent related symptoms include dysuria, urgency/frequency, and flank pain. After an extensive discussion of the risks and benefits of the above  treatment options, the patient would like to proceed with SWL. Will schedule after RUS reviewed   Glendia JAYSON Barba, MD  [facility] [address] [place], [place] [address] [phone]

## 2023-09-12 NOTE — Patient Instructions (Signed)
 Scheduling number: [id number]

## 2023-09-13 ENCOUNTER — Ambulatory Visit
Admission: RE | Admit: 2023-09-13 | Discharge: 2023-09-13 | Disposition: A | Discharge status: Home / Self Care | Source: Ambulatory Visit | Attending: Urology | Admitting: Urology

## 2023-09-13 DIAGNOSIS — N200 Calculus of kidney: Secondary | ICD-10-CM | POA: Insufficient documentation

## 2023-09-14 ENCOUNTER — Ambulatory Visit: Admitting: Urology

## 2023-09-16 ENCOUNTER — Ambulatory Visit: Payer: Self-pay | Admitting: Urology

## 2023-09-16 DIAGNOSIS — N200 Calculus of kidney: Secondary | ICD-10-CM

## 2023-09-16 DIAGNOSIS — R310 Gross hematuria: Secondary | ICD-10-CM

## 2023-09-19 ENCOUNTER — Ambulatory Visit
Admission: RE | Admit: 2023-09-19 | Discharge: 2023-09-19 | Disposition: A | Discharge status: Home / Self Care | Source: Ambulatory Visit | Attending: Urology | Admitting: Urology

## 2023-09-19 DIAGNOSIS — N200 Calculus of kidney: Secondary | ICD-10-CM | POA: Diagnosis present

## 2023-09-19 DIAGNOSIS — R310 Gross hematuria: Secondary | ICD-10-CM | POA: Diagnosis present

## 2023-10-08 ENCOUNTER — Ambulatory Visit: Payer: Self-pay | Admitting: Urology

## 2023-10-10 ENCOUNTER — Other Ambulatory Visit: Payer: Self-pay | Admitting: Urology

## 2023-10-10 ENCOUNTER — Other Ambulatory Visit: Payer: Self-pay

## 2023-10-10 DIAGNOSIS — N200 Calculus of kidney: Secondary | ICD-10-CM

## 2023-10-10 NOTE — Progress Notes (Unsigned)
 ESWL ORDER FORM  Expected date of procedure: Patient preference  Surgeon: Scheduled  Post op standing: 2-4wk PA follow up w/KUB prior  Anticoagulation/Aspirin/NSAID standing order: Hold all 72 hours prior: ASA on med list  Anesthesia standing order: MAC  VTE standing: SCD's  Dx: Left Nephrolithiasis  Procedure: left Extracorporeal shock wave lithotripsy  CPT : 50590  Standing Order Set:   *NPO after mn, KUB  *NS 150ml/hr, Keflex 500mg  PO, Benadryl  25mg  PO, Valium  10mg  PO, Zofran  4mg  IV  Medications if other than standing orders:   NONE

## 2023-10-12 NOTE — Progress Notes (Signed)
    [facility] ESWL POSTING SHEET        Patient Name: Theresa Thomas  DOB: [birth year]  MRN: [id number]  Surgeon:  Glendia Barba, MD  Diagnosis:  Left Nephrolithiasis  CPT: 49409  ESWL DATE: 10/25/2023  ESWL TIME: 0730  Special Needs/Requirements: None       Cardiac/Medical/Pulmonary Clearance needed: No       Form Faxed to [facility]- [phone] Date:   Date: 10/12/23       Form Faxed to [facility]- [phone]  Date:  Date: 10/12/23           Copy Made for Insurance PA:  Date: 10/12/23       Orders Entered in to Epic:  Date: 10/12/23

## 2023-10-22 ENCOUNTER — Other Ambulatory Visit: Payer: Self-pay | Admitting: *Deleted

## 2023-10-22 ENCOUNTER — Telehealth: Payer: Self-pay | Admitting: *Deleted

## 2023-10-22 MED ORDER — ONDANSETRON 4 MG PO TBDP
4.0000 mg | ORAL_TABLET | Freq: Three times a day (TID) | ORAL | 0 refills | Status: DC | PRN
Start: 2023-10-22 — End: 2023-12-06

## 2023-10-22 NOTE — Telephone Encounter (Signed)
 Talked with patient and she states she is feeling nauseous and throw up 3 times since 10:30 this morning, No fever or chills . Talked with Sam about this, she give a verbal to send in some Zofran  [facility] to [facility] . Medication sent.

## 2023-10-25 ENCOUNTER — Ambulatory Visit

## 2023-10-25 ENCOUNTER — Encounter
Admission: RE | Disposition: A | Discharge status: Home / Self Care | Payer: Self-pay | Source: Home / Self Care | Attending: Urology

## 2023-10-25 ENCOUNTER — Ambulatory Visit
Admission: RE | Admit: 2023-10-25 | Discharge: 2023-10-25 | Disposition: A | Discharge status: Home / Self Care | Attending: Urology | Admitting: Urology

## 2023-10-25 ENCOUNTER — Other Ambulatory Visit: Payer: Self-pay

## 2023-10-25 ENCOUNTER — Encounter: Payer: Self-pay | Admitting: Urology

## 2023-10-25 DIAGNOSIS — N200 Calculus of kidney: Secondary | ICD-10-CM | POA: Diagnosis present

## 2023-10-25 HISTORY — PX: EXTRACORPOREAL SHOCK WAVE LITHOTRIPSY: SHX1557

## 2023-10-25 SURGERY — LITHOTRIPSY, ESWL
Anesthesia: Moderate Sedation | Laterality: Left

## 2023-10-25 MED ORDER — ONDANSETRON HCL 4 MG/2ML IJ SOLN
INTRAMUSCULAR | Status: AC
  Filled 2023-10-25: qty 2

## 2023-10-25 MED ORDER — TAMSULOSIN HCL 0.4 MG PO CAPS: 0.4000 mg | ORAL_CAPSULE | Freq: Every day | ORAL | 0 refills | Status: DC

## 2023-10-25 MED ORDER — OXYCODONE HCL 5 MG PO TABS: 5.0000 mg | ORAL_TABLET | Freq: Four times a day (QID) | ORAL | 0 refills | Status: DC | PRN

## 2023-10-25 MED ORDER — DIPHENHYDRAMINE HCL 25 MG PO CAPS
ORAL_CAPSULE | ORAL | Status: AC
Start: 2023-10-25 — End: 2023-10-25
  Filled 2023-10-25: qty 1

## 2023-10-25 MED ORDER — SODIUM CHLORIDE 0.9 % IV SOLN: INTRAVENOUS | Status: DC

## 2023-10-25 MED ORDER — CEPHALEXIN 500 MG PO CAPS
ORAL_CAPSULE | ORAL | Status: AC
  Filled 2023-10-25: qty 1

## 2023-10-25 MED ORDER — ONDANSETRON HCL 4 MG/2ML IJ SOLN
4.0000 mg | Freq: Once | INTRAMUSCULAR | Status: AC
  Administered 2023-10-25: 4 mg via INTRAVENOUS

## 2023-10-25 MED ORDER — DIAZEPAM 5 MG PO TABS
ORAL_TABLET | ORAL | Status: AC
  Filled 2023-10-25: qty 2

## 2023-10-25 MED ORDER — DIPHENHYDRAMINE HCL 25 MG PO CAPS
25.0000 mg | ORAL_CAPSULE | ORAL | Status: AC
  Administered 2023-10-25: 25 mg via ORAL

## 2023-10-25 MED ORDER — CEPHALEXIN 500 MG PO CAPS
500.0000 mg | ORAL_CAPSULE | Freq: Once | ORAL | Status: AC
  Administered 2023-10-25: 500 mg via ORAL

## 2023-10-25 MED ORDER — DIAZEPAM 5 MG PO TABS
10.0000 mg | ORAL_TABLET | ORAL | Status: AC
  Administered 2023-10-25: 10 mg via ORAL

## 2023-10-25 NOTE — Discharge Instructions (Addendum)
 As per the [facility] discharge instructions A prescription for pain medication was sent to your pharmacy A prescription for tamsulosin  which will help you pass stone fragments was also sent to your pharmacy Call [facility] at [phone] for pain not controlled with oral medications or fever greater than 101 degrees You have a follow-up appointment scheduled 11/15/2023 at 10:20 AM

## 2023-10-25 NOTE — Interval H&P Note (Signed)
 History and Physical Interval Note:  CV:RRR Lungs:clear  10/25/2023 2:49 PM  Theresa Thomas  has presented today for surgery, with the diagnosis of Left Nephrolithiasis.  The various methods of treatment have been discussed with the patient and family. After consideration of risks, benefits and other options for treatment, the patient has consented to  Procedure(s): LITHOTRIPSY, ESWL (Left) as a surgical intervention.  The patient's history has been reviewed, patient examined, no change in status, stable for surgery.  I have reviewed the patient's chart and labs.  Questions were answered to the patient's satisfaction.     [name] C Lilee Aldea

## 2023-10-25 NOTE — H&P (Signed)
 Urology H&P   Assessment/Recommendations:  1.  Left renal pelvic calculus Patient has elected shockwave lithotripsy.  Procedure has been discussed in detail including potential risks of bleeding/hematoma, infection and residual stone fragments requiring pretreatment.   Urologic history: 1.  Left nephrolithiasis Left renal calculus identified on hematuria evaluation 09/2022   2.  Gross/microhematuria Evaluation August 2024 CT urogram findings as above Cystoscopy unremarkable  History of Present Illness: Theresa Thomas is a 62 y.o. female with a history of a left lower pole renal calculus.  On annual follow-up August 2025 stone had migrated to the region of the renal pelvis which was confirmed on renal ultrasound.  After discussing treatment options he elected shockwave lithotripsy.  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     Past Surgical History:  Procedure Laterality Date   COLONOSCOPY WITH PROPOFOL  N/A 10/26/2014   Procedure: COLONOSCOPY WITH PROPOFOL ;  Surgeon: Lamar ONEIDA Holmes, MD;  Location: [facility] ENDOSCOPY;  Service: Endoscopy;  Laterality: N/A;   COLONOSCOPY WITH PROPOFOL  N/A 02/24/2021   Procedure: COLONOSCOPY WITH PROPOFOL ;  Surgeon: Jinny Carmine, MD;  Location: [facility] ENDOSCOPY;  Service: Endoscopy;  Laterality: N/A;   KNEE ARTHROPLASTY Right 06/01/2021   Procedure: COMPUTER ASSISTED TOTAL KNEE ARTHROPLASTY;  Surgeon: Mardee Lynwood SQUIBB, MD;  Location: [facility] ORS;  Service: Orthopedics;  Laterality: Right;   KNEE ARTHROSCOPY Left 01/20/2016   Procedure: ARTHROSCOPY KNEE, PARTIAL MEDIAL MENISCECTOMY, CHONDROPLASTY;  Surgeon: Helayne Glenn, MD;  Location: [facility] ORS;  Service: Orthopedics;  Laterality: Left;   REPLACEMENT TOTAL KNEE Left  2019    Home Medications:  Current Meds  Medication Sig   acetaminophen  (TYLENOL ) 500 MG tablet Take 1,000 mg by mouth every 6 (six) hours as needed for moderate pain or headache.   atenolol  (TENORMIN ) 50 MG tablet Take 25 mg by mouth daily.   atorvastatin  (LIPITOR) 20 MG tablet Take 20 mg by mouth at bedtime.   cetirizine (ZYRTEC) 10 MG tablet Take 10 mg by mouth daily as needed for allergies.   diazepam  (VALIUM ) 2 MG tablet 1 tab po 30 min prior to procedure   diphenhydrAMINE  (BENADRYL ) 25 MG tablet Take 25 mg by mouth every 6 (six) hours as needed for allergies or sleep.   levothyroxine  (SYNTHROID ) 100 MCG tablet Take 100 mcg by mouth daily before breakfast.   ondansetron  (ZOFRAN -ODT) 4 MG disintegrating tablet Take 1 tablet (4 mg total) by mouth every 8 (eight) hours as needed for nausea or vomiting.    Allergies:  Allergies  Allergen Reactions   Tape Rash   Cortisone Rash    Had a rash that lasted 3 weeks. Was given with marcaine  and xylocaine . Not sure which of the three she had a reaction to.   Ethyl Chloride Rash    Gets a long acting rash from spray that last up to a month   Lidocaine  Hcl Rash    Family History  Problem Relation Age of Onset   Breast cancer Mother 16   Kidney cancer Neg Hx    Bladder Cancer Neg Hx     Social History:  reports that she has never smoked. She has never used smokeless tobacco. She reports  current alcohol use of about 2.0 standard drinks of alcohol per week. She reports that she does not use drugs.  ROS: A complete review of systems was performed.  All systems are negative except for pertinent findings as noted.  Physical Exam:  Vital signs in last 24 hours: Temp:  [97.9 F (36.6 C)] 97.9 F (36.6 C) (09/18 1317) Pulse Rate:  [64] 64 (09/18 1317) Resp:  [17] 17 (09/18 1317) BP: (125)/(75) 125/75 (09/18 1317) SpO2:  [97 %] 97 % (09/18 1317) Weight:  [120.2 kg] 120.2 kg (09/18 1317) Constitutional:  Alert, No acute distress HEENT:  [place] AT Respiratory: Normal respiratory effort, lungs clear bilaterally Psychiatric: Normal mood and affect     10/25/2023, 2:47 PM  Glendia Barba,  MD

## 2023-10-26 ENCOUNTER — Encounter: Payer: Self-pay | Admitting: Urology

## 2023-10-31 ENCOUNTER — Encounter: Payer: Self-pay | Admitting: Urology

## 2023-11-08 ENCOUNTER — Other Ambulatory Visit: Payer: Self-pay | Admitting: Physician Assistant

## 2023-11-15 ENCOUNTER — Ambulatory Visit: Admitting: Urology

## 2023-11-29 ENCOUNTER — Ambulatory Visit: Admitting: Urology

## 2023-12-04 NOTE — Progress Notes (Unsigned)
 12/06/2023 1:10 PM   Theresa Thomas [birth year] [id number]  Referring provider: Glover Lenis, MD [phone] [place],  [place] [address]  Urological history: 1.  Left nephrolithiasis Left renal calculus identified on hematuria evaluation 09/2022   2.  Gross/microhematuria Evaluation August 2024 CT urogram findings as above Cystoscopy unremarkable   No chief complaint on file.  HPI: Theresa Thomas is a 63 y.o. woman who presents today for follow-up.  Previous records reviewed.  She underwent ESWL on October 25, 2023 for a left pelvic junction stone.  Her postprocedural course was as expected and uneventful.  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     Surgical History: Past Surgical History:  Procedure Laterality Date   COLONOSCOPY WITH PROPOFOL  N/A 10/26/2014   Procedure: COLONOSCOPY WITH PROPOFOL ;  Surgeon: Lamar ONEIDA Holmes, MD;  Location: [facility] ENDOSCOPY;  Service: Endoscopy;  Laterality: N/A;   COLONOSCOPY WITH PROPOFOL  N/A 02/24/2021   Procedure: COLONOSCOPY WITH PROPOFOL ;  Surgeon: Jinny Carmine, MD;  Location: [facility] ENDOSCOPY;  Service: Endoscopy;  Laterality: N/A;   EXTRACORPOREAL SHOCK WAVE LITHOTRIPSY Left 10/25/2023   Procedure: LITHOTRIPSY, ESWL;  Surgeon: Twylla Glendia BROCKS, MD;  Location: [facility] ORS;  Service: Urology;  Laterality: Left;   KNEE ARTHROPLASTY Right 06/01/2021   Procedure: COMPUTER ASSISTED TOTAL KNEE ARTHROPLASTY;  Surgeon: Mardee Lynwood SQUIBB, MD;  Location: [facility] ORS;  Service: Orthopedics;  Laterality: Right;   KNEE ARTHROSCOPY Left 01/20/2016   Procedure: ARTHROSCOPY KNEE, PARTIAL MEDIAL MENISCECTOMY, CHONDROPLASTY;  Surgeon: Helayne Glenn, MD;  Location: [facility]  ORS;  Service: Orthopedics;  Laterality: Left;   REPLACEMENT TOTAL KNEE Left 2019    Home Medications:  Allergies as of 12/06/2023       Reactions   Tape Rash   Cortisone Rash   Had a rash that lasted 3 weeks. Was given with marcaine  and xylocaine . Not sure which of the three she had a reaction to.   Ethyl Chloride Rash   Gets a long acting rash from spray that last up to a month   Lidocaine  Hcl Rash        Medication List        Accurate as of December 04, 2023  1:10 PM. If you have any questions, ask your nurse or doctor.          acetaminophen  500 MG tablet Commonly known as: TYLENOL  Take 1,000 mg by mouth every 6 (six) hours as needed for moderate pain or headache.   aspirin 81 MG chewable tablet Chew 1 tablet every day by oral route.   atenolol  50 MG tablet Commonly known as: TENORMIN  Take 25 mg by mouth daily.   atorvastatin  20 MG tablet Commonly known as: LIPITOR Take 20 mg by mouth at bedtime.   cetirizine 10 MG tablet Commonly known as: ZYRTEC Take 10 mg by mouth daily as needed for allergies.   diphenhydrAMINE  25 MG tablet Commonly known as: BENADRYL  Take 25 mg by mouth every 6 (six) hours as needed for allergies or sleep.   fenofibrate  145 MG tablet Commonly known as: TRICOR   Take 145 mg by mouth daily.   levothyroxine  100 MCG tablet Commonly known as: SYNTHROID  Take 100 mcg by mouth daily before breakfast.   ondansetron  4 MG disintegrating tablet Commonly known as: ZOFRAN -ODT Take 1 tablet (4 mg total) by mouth every 8 (eight) hours as needed for nausea or vomiting.   oxyCODONE  5 MG immediate release tablet Commonly known as: Oxy IR/ROXICODONE  Take 1 tablet (5 mg total) by mouth every 6 (six) hours as needed for severe pain (pain score 7-10).   tamsulosin  0.4 MG Caps capsule Commonly known as: FLOMAX  Take 1 capsule (0.4 mg total) by mouth daily after breakfast.        Allergies:  Allergies  Allergen Reactions   Tape Rash    Cortisone Rash    Had a rash that lasted 3 weeks. Was given with marcaine  and xylocaine . Not sure which of the three she had a reaction to.   Ethyl Chloride Rash    Gets a long acting rash from spray that last up to a month   Lidocaine  Hcl Rash    Family History: Family History  Problem Relation Age of Onset   Breast cancer Mother 30   Kidney cancer Neg Hx    Bladder Cancer Neg Hx     Social History:  reports that she has never smoked. She has never used smokeless tobacco. She reports current alcohol use of about 2.0 standard drinks of alcohol per week. She reports that she does not use drugs.  ROS: Pertinent ROS in HPI  Physical Exam: There were no vitals taken for this visit.  Constitutional:  Well nourished. Alert and oriented, No acute distress. HEENT: [place] AT, moist mucus membranes.  Trachea midline, no masses. Cardiovascular: No clubbing, cyanosis, or edema. Respiratory: Normal respiratory effort, no increased work of breathing. GU: No CVA tenderness.  No bladder fullness or masses.  Recession of labia minora, dry, pale vulvar vaginal mucosa and loss of mucosal ridges and folds.  Normal urethral meatus, no lesions, no prolapse, no discharge.   No urethral masses, tenderness and/or tenderness. No bladder fullness, tenderness or masses. *** vagina mucosa, *** estrogen effect, no discharge, no lesions, *** pelvic support, *** cystocele and *** rectocele noted.  No cervical motion tenderness.  Uterus is freely mobile and non-fixed.  No adnexal/parametria masses or tenderness noted.  Anus and perineum are without rashes or lesions.   ***  Neurologic: Grossly intact, no focal deficits, moving all 4 extremities. Psychiatric: Normal mood and affect.    Laboratory Data: See Epic and HPI   I have reviewed the labs.   Pertinent Imaging: KUB, radiologist interpretation still pending  I have independently reviewed the films.  See HPI.   Assessment & Plan:  ***  1. Left renal stone   - UA *** - KUB ***  No follow-ups on file.  These notes generated with voice recognition software. I apologize for typographical errors.  Theresa Thomas  [facility] [address]  [address] [place], [place] [address] [phone]

## 2023-12-06 ENCOUNTER — Ambulatory Visit
Admission: RE | Admit: 2023-12-06 | Discharge: 2023-12-06 | Disposition: A | Discharge status: Home / Self Care | Attending: Urology | Admitting: Urology

## 2023-12-06 ENCOUNTER — Ambulatory Visit: Admitting: Urology

## 2023-12-06 ENCOUNTER — Encounter: Payer: Self-pay | Admitting: Urology

## 2023-12-06 ENCOUNTER — Ambulatory Visit
Admission: RE | Admit: 2023-12-06 | Discharge: 2023-12-06 | Disposition: A | Discharge status: Home / Self Care | Source: Ambulatory Visit | Attending: Urology | Admitting: Urology

## 2023-12-06 VITALS — BP 130/84 | HR 68 | Ht 68.0 in | Wt 265.0 lb

## 2023-12-06 DIAGNOSIS — N200 Calculus of kidney: Secondary | ICD-10-CM | POA: Insufficient documentation

## 2023-12-06 LAB — MICROSCOPIC EXAMINATION
Epithelial Cells (non renal): >10 /HPF — AB (ref 0–10)
WBC, UA: >30 /HPF — AB (ref 0–5)

## 2023-12-06 LAB — URINALYSIS, COMPLETE
Bilirubin, UA: NEGATIVE
Glucose, UA: NEGATIVE
Ketones, UA: NEGATIVE
Nitrite, UA: NEGATIVE
Protein,UA: NEGATIVE
Specific Gravity, UA: 1.03 (ref 1.005–1.030)
Urobilinogen, Ur: 0.2 mg/dL (ref 0.2–1.0)
pH, UA: 5.5 (ref 5.0–7.5)

## 2023-12-06 NOTE — Patient Instructions (Addendum)
Litholyte

## 2023-12-18 ENCOUNTER — Ambulatory Visit: Payer: Self-pay | Admitting: Urology

## 2023-12-18 LAB — STONE ANALYSIS
Calcium Oxalate Dihydrate: 70 %
Calcium Oxalate Monohydrate: 30 %
Weight Calculi: 50 mg

## 2024-01-13 NOTE — H&P (View-Only) (Signed)
 01/17/2024 10:13 PM   Theresa Thomas 1960/06/25 [id number]  Referring provider: Glover Lenis, MD [phone] [place],  [place] [address]  Urological history: 1.  Left nephrolithiasis - stone composition 70% calcium  oxalate dihydrate and 30% calcium  oxalate monohydrate - Left renal calculus identified on hematuria evaluation 09/2022   2.  Gross/microhematuria - Evaluation August 2024 - CT urogram findings as above - Cystoscopy unremarkable   Chief Complaint  Patient presents with   Nephrolithiasis   HPI: Theresa Thomas is a 63 y.o. woman who presents today for follow-up.  Previous records reviewed.  She underwent ESWL on October 25, 2023 for a left pelvic junction stone.  Her postprocedural course was as expected and uneventful.  She did have some discomfort prior to ESWL.  She has passed fragments and she has brought them in today for analysis.  UA yellow slightly cloudy, specific gravity greater than 1.030, pH 5.5, 1+ heme, 2+ leukocyte, greater than 30 WBCs, 3-10 RBCs, greater than 10 epithelial cells, mucus threads present, and  many bacteria.  KUB the previously seen left UPJ stone is not visible on today's KUB.  She was seen in the ED on 12/08 for flank pain, dark urine and vomiting.  CT renal stone shows a 6 mm stone in the mid/distal right ureter with moderate right hydronephrosis.  UA with hematuria and pyuria.  CBC was normal.  Serum creatinine 1.20.    She is feeling okay today.  She has some minor discomfort for in the right groin and right flank.  Patient denies any modifying or aggravating factors.  Patient denies any recent UTI's, gross hematuria, dysuria or suprapubic/flank pain.  Patient denies any fevers, chills, nausea or vomiting.    UA yellow slightly cloudy, specific gravity 1.020, pH 6.0, 1+ protein, 3+ heme, 1+ leukocyte, 11-30 WBCs, greater than 30 RBCs, 0-10 epithelial cells mucus threads present and moderate bacteria.  KUB the right distal  stone is not visible.    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     Surgical History: Past Surgical History:  Procedure Laterality Date   COLONOSCOPY WITH PROPOFOL  N/A 10/26/2014   Procedure: COLONOSCOPY WITH PROPOFOL ;  Surgeon: Lamar ONEIDA Holmes, MD;  Location: [facility] ENDOSCOPY;  Service: Endoscopy;  Laterality: N/A;   COLONOSCOPY WITH PROPOFOL  N/A 02/24/2021   Procedure: COLONOSCOPY WITH PROPOFOL ;  Surgeon: Jinny Carmine, MD;  Location: [facility] ENDOSCOPY;  Service: Endoscopy;  Laterality: N/A;   EXTRACORPOREAL SHOCK WAVE LITHOTRIPSY Left 10/25/2023   Procedure: LITHOTRIPSY, ESWL;  Surgeon: Twylla Glendia BROCKS, MD;  Location: [facility] ORS;  Service: Urology;  Laterality: Left;   KNEE ARTHROPLASTY Right 06/01/2021   Procedure: COMPUTER ASSISTED TOTAL KNEE ARTHROPLASTY;  Surgeon: Mardee Lynwood SQUIBB, MD;  Location: [facility] ORS;  Service: Orthopedics;  Laterality: Right;   KNEE ARTHROSCOPY Left 01/20/2016   Procedure: ARTHROSCOPY KNEE, PARTIAL MEDIAL MENISCECTOMY, CHONDROPLASTY;  Surgeon: Helayne Glenn, MD;  Location: [facility] ORS;  Service: Orthopedics;  Laterality: Left;   REPLACEMENT TOTAL KNEE Left 2019    Home Medications:  Allergies as of 01/17/2024       Reactions   Tape Rash   Cortisone Rash  Had a rash that lasted 3 weeks. Was given with marcaine  and xylocaine . Not sure which of the three she had a reaction to.   Ethyl Chloride Rash   Gets a long acting rash from spray that last up to a month   Lidocaine  Hcl Rash        Medication List        Accurate as of January 17, 2024 11:59 PM. If you have any questions, ask your nurse or doctor.          acetaminophen  500 MG tablet Commonly known as: TYLENOL  Take 1,000 mg by mouth every 6 (six) hours  as needed for moderate pain or headache.   aspirin 81 MG chewable tablet Chew 81 mg by mouth daily.   atenolol  25 MG tablet Commonly known as: TENORMIN  Take 25 mg by mouth daily.   atorvastatin  20 MG tablet Commonly known as: LIPITOR Take 20 mg by mouth at bedtime.   cetirizine 10 MG tablet Commonly known as: ZYRTEC Take 10 mg by mouth daily as needed for allergies.   diphenhydrAMINE  25 MG tablet Commonly known as: BENADRYL  Take 25 mg by mouth every 6 (six) hours as needed for allergies or sleep.   HYDROcodone -acetaminophen  5-325 MG tablet Commonly known as: NORCO/VICODIN Take 1 tablet by mouth 3 (three) times daily as needed for up to 3 days for moderate pain (pain score 4-6).   levothyroxine  112 MCG tablet Commonly known as: SYNTHROID  Take 112 mcg by mouth daily before breakfast.   neomycin-polymyxin b-dexamethasone  3.5-10000-0.1 Oint Commonly known as: MAXITROL SMARTSIG:sparingly In Eye(s)   tamsulosin  0.4 MG Caps capsule Commonly known as: FLOMAX  Take 1 capsule (0.4 mg total) by mouth daily.        Allergies:  Allergies  Allergen Reactions   Tape Rash   Cortisone Rash    Had a rash that lasted 3 weeks. Was given with marcaine  and xylocaine . Not sure which of the three she had a reaction to.   Ethyl Chloride Rash    Gets a long acting rash from spray that last up to a month   Lidocaine  Hcl Rash    Family History: Family History  Problem Relation Age of Onset   Breast cancer Mother 35   Kidney cancer Neg Hx    Bladder Cancer Neg Hx     Social History:  reports that she has never smoked. She has never used smokeless tobacco. She reports current alcohol use of about 2.0 standard drinks of alcohol per week. She reports that she does not use drugs.  ROS: Pertinent ROS in HPI  Physical Exam: BP 119/80 (BP Location: Left Arm, Patient Position: Sitting, Cuff Size: Large)   Pulse 70   Wt 265 lb (120.2 kg)   SpO2 97%   BMI 40.29 kg/m   Constitutional:   Well nourished. Alert and oriented, No acute distress. HEENT: [place] AT, moist mucus membranes.  Trachea midline Cardiovascular: No clubbing, cyanosis, or edema. Respiratory: Normal respiratory effort, no increased work of breathing. Neurologic: Grossly intact, no focal deficits, moving all 4 extremities. Psychiatric: Normal mood and affect.    Laboratory Data: See Epic and HPI   I have reviewed the labs.   Pertinent Imaging: CLINICAL DATA:  Abdominal/flank pain, stone suspected. Right-sided pain.   EXAM: CT ABDOMEN AND PELVIS WITHOUT CONTRAST   TECHNIQUE: Multidetector CT imaging of the abdomen and pelvis was performed following the standard protocol without IV contrast.   RADIATION DOSE REDUCTION: This exam was performed according to  the departmental dose-optimization program which includes automated exposure control, adjustment of the mA and/or kV according to patient size and/or use of iterative reconstruction technique.   COMPARISON:  09/19/2023   FINDINGS: Lower chest: Lung bases are clear.   Hepatobiliary: Normal appearance of the liver. Multiple layering gallstones. No gallbladder distension. No inflammatory changes around the gallbladder.   Pancreas: Unremarkable. No pancreatic ductal dilatation or surrounding inflammatory changes.   Spleen: Spleen is prominent for size measuring 12.4 cm in the craniocaudal dimension and stable from the previous examination.   Adrenals/Urinary Tract: Normal adrenal glands. Moderate right hydronephrosis. 3 mm stone in the right kidney lower pole. Mild right perinephric stranding. Dilatation of the proximal and mid right ureter with a 6 mm stone in the mid/distal right ureter. Normal appearance of the urinary bladder. Tiny stone in the left kidney lower pole. Mild fullness of the left renal pelvis without left hydronephrosis.   Stomach/Bowel: Probable small hiatal hernia. Otherwise, normal appearance of stomach. No bowel  dilatation or obstruction. No focal bowel inflammation.   Vascular/Lymphatic: Aortic atherosclerosis. No enlarged abdominal or pelvic lymph nodes.   Reproductive: Uterus and bilateral adnexa are unremarkable.   Other: Stranding around the proximal right ureter and right retroperitoneal structures. These findings are related to the obstructing right ureter stone. Negative for free fluid. Negative for free air. Tiny umbilical hernia containing fat.   Musculoskeletal: Mild disc space narrowing at L5-S1. No acute bone abnormality.   IMPRESSION: 1. Moderate right hydroureteronephrosis secondary to a 6 mm stone in the mid/distal right ureter. 2. Bilateral renal calculi. 3. Cholelithiasis without evidence for acute cholecystitis. 4. Aortic Atherosclerosis (ICD10-I70.0).     Electronically Signed   By: Juliene Balder M.D.   On: 01/14/2024 13:26  KUB, radiologist interpretation still pending I have independently reviewed the films.  See HPI.    Assessment and Plan:  1. Right ureteral stone - explained that her stone was not visible on KUB, so we cannot pursue ESWL and we will need to treat this stone via ureteroscopy with laser lithotripsy with stent placement -Patient counseled on operative technique and associated risks. Informed of planned ureteral stent placement, expected to remain in situ for 3-10 days, with potential for stent-related symptoms including flank pain, bladder discomfort, dysuria, urgency, frequency, urinary incontinence, and gross hematuria. -Stent removal options reviewed--either via office cystoscopy or self-removal via extraction string, to be determined intraoperatively. -Residual nephrolithiasis or ureterolithiasis may persist post-procedure, potentially requiring future intervention. -Intraoperative risks include ureteral injury, which may necessitate open surgical repair. Additional risks include hemorrhage, infection, and complications related to general  anesthesia (e.g., MI, CVA, paralysis, coma, death). -Patient advised to contact clinic or present to ED for fever, uncontrolled pain, or intractable emesis to facilitate urgent evaluation and management. - UA w/ pyuria, hematuria, and bacteria - urine sent for culture   2. Left renal stone  - successfully treated with ESWL  3. Microscopic hematuria - UA > 30 RBC's  - will need to recheck after definitive treatment for stone  Return for right URS/LL/ureteral stent placement.  These notes generated with voice recognition software. I apologize for typographical errors.  Theresa Thomas  [facility] [address]  [address] [place], [place] [address] [phone]  Surgical Physician Order [facility]  * Scheduling expectation : 01/23/2024 with Dr. Twylla   *Length of Case:   *Clearance needed: no  *Anticoagulation Instructions: N/A  *Aspirin Instructions: N/A  *Post-op visit Date/Instructions:  TBD  *Diagnosis: Right Ureteral Stone  *Procedure: right Ureteroscopy w/laser lithotripsy & stent placement (47643)   Additional orders: N/A  -Admit type: OUTpatient  -Anesthesia: General  -VTE Prophylaxis Standing Order SCDs       Other:   -Standing Lab Orders Per Anesthesia    Lab other: None  -Standing Test orders EKG/Chest x-ray per Anesthesia       Test other:   - Medications:  Ancef  2gm IV  -Other orders:  N/A

## 2024-01-13 NOTE — Progress Notes (Unsigned)
 01/17/2024 10:13 PM   Theresa Thomas 1960/06/25 [id number]  Referring provider: Glover Lenis, MD [phone] [place],  [place] [address]  Urological history: 1.  Left nephrolithiasis - stone composition 70% calcium  oxalate dihydrate and 30% calcium  oxalate monohydrate - Left renal calculus identified on hematuria evaluation 09/2022   2.  Gross/microhematuria - Evaluation August 2024 - CT urogram findings as above - Cystoscopy unremarkable   Chief Complaint  Patient presents with   Nephrolithiasis   HPI: Theresa Thomas is a 63 y.o. woman who presents today for follow-up.  Previous records reviewed.  She underwent ESWL on October 25, 2023 for a left pelvic junction stone.  Her postprocedural course was as expected and uneventful.  She did have some discomfort prior to ESWL.  She has passed fragments and she has brought them in today for analysis.  UA yellow slightly cloudy, specific gravity greater than 1.030, pH 5.5, 1+ heme, 2+ leukocyte, greater than 30 WBCs, 3-10 RBCs, greater than 10 epithelial cells, mucus threads present, and  many bacteria.  KUB the previously seen left UPJ stone is not visible on today's KUB.  She was seen in the ED on 12/08 for flank pain, dark urine and vomiting.  CT renal stone shows a 6 mm stone in the mid/distal right ureter with moderate right hydronephrosis.  UA with hematuria and pyuria.  CBC was normal.  Serum creatinine 1.20.    She is feeling okay today.  She has some minor discomfort for in the right groin and right flank.  Patient denies any modifying or aggravating factors.  Patient denies any recent UTI's, gross hematuria, dysuria or suprapubic/flank pain.  Patient denies any fevers, chills, nausea or vomiting.    UA yellow slightly cloudy, specific gravity 1.020, pH 6.0, 1+ protein, 3+ heme, 1+ leukocyte, 11-30 WBCs, greater than 30 RBCs, 0-10 epithelial cells mucus threads present and moderate bacteria.  KUB the right distal  stone is not visible.    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     Surgical History: Past Surgical History:  Procedure Laterality Date   COLONOSCOPY WITH PROPOFOL  N/A 10/26/2014   Procedure: COLONOSCOPY WITH PROPOFOL ;  Surgeon: Lamar ONEIDA Holmes, MD;  Location: [facility] ENDOSCOPY;  Service: Endoscopy;  Laterality: N/A;   COLONOSCOPY WITH PROPOFOL  N/A 02/24/2021   Procedure: COLONOSCOPY WITH PROPOFOL ;  Surgeon: Jinny Carmine, MD;  Location: [facility] ENDOSCOPY;  Service: Endoscopy;  Laterality: N/A;   EXTRACORPOREAL SHOCK WAVE LITHOTRIPSY Left 10/25/2023   Procedure: LITHOTRIPSY, ESWL;  Surgeon: Twylla Glendia BROCKS, MD;  Location: [facility] ORS;  Service: Urology;  Laterality: Left;   KNEE ARTHROPLASTY Right 06/01/2021   Procedure: COMPUTER ASSISTED TOTAL KNEE ARTHROPLASTY;  Surgeon: Mardee Lynwood SQUIBB, MD;  Location: [facility] ORS;  Service: Orthopedics;  Laterality: Right;   KNEE ARTHROSCOPY Left 01/20/2016   Procedure: ARTHROSCOPY KNEE, PARTIAL MEDIAL MENISCECTOMY, CHONDROPLASTY;  Surgeon: Helayne Glenn, MD;  Location: [facility] ORS;  Service: Orthopedics;  Laterality: Left;   REPLACEMENT TOTAL KNEE Left 2019    Home Medications:  Allergies as of 01/17/2024       Reactions   Tape Rash   Cortisone Rash  Had a rash that lasted 3 weeks. Was given with marcaine  and xylocaine . Not sure which of the three she had a reaction to.   Ethyl Chloride Rash   Gets a long acting rash from spray that last up to a month   Lidocaine  Hcl Rash        Medication List        Accurate as of January 17, 2024 11:59 PM. If you have any questions, ask your nurse or doctor.          acetaminophen  500 MG tablet Commonly known as: TYLENOL  Take 1,000 mg by mouth every 6 (six) hours  as needed for moderate pain or headache.   aspirin 81 MG chewable tablet Chew 81 mg by mouth daily.   atenolol  25 MG tablet Commonly known as: TENORMIN  Take 25 mg by mouth daily.   atorvastatin  20 MG tablet Commonly known as: LIPITOR Take 20 mg by mouth at bedtime.   cetirizine 10 MG tablet Commonly known as: ZYRTEC Take 10 mg by mouth daily as needed for allergies.   diphenhydrAMINE  25 MG tablet Commonly known as: BENADRYL  Take 25 mg by mouth every 6 (six) hours as needed for allergies or sleep.   HYDROcodone -acetaminophen  5-325 MG tablet Commonly known as: NORCO/VICODIN Take 1 tablet by mouth 3 (three) times daily as needed for up to 3 days for moderate pain (pain score 4-6).   levothyroxine  112 MCG tablet Commonly known as: SYNTHROID  Take 112 mcg by mouth daily before breakfast.   neomycin-polymyxin b-dexamethasone  3.5-10000-0.1 Oint Commonly known as: MAXITROL SMARTSIG:sparingly In Eye(s)   tamsulosin  0.4 MG Caps capsule Commonly known as: FLOMAX  Take 1 capsule (0.4 mg total) by mouth daily.        Allergies:  Allergies  Allergen Reactions   Tape Rash   Cortisone Rash    Had a rash that lasted 3 weeks. Was given with marcaine  and xylocaine . Not sure which of the three she had a reaction to.   Ethyl Chloride Rash    Gets a long acting rash from spray that last up to a month   Lidocaine  Hcl Rash    Family History: Family History  Problem Relation Age of Onset   Breast cancer Mother 35   Kidney cancer Neg Hx    Bladder Cancer Neg Hx     Social History:  reports that she has never smoked. She has never used smokeless tobacco. She reports current alcohol use of about 2.0 standard drinks of alcohol per week. She reports that she does not use drugs.  ROS: Pertinent ROS in HPI  Physical Exam: BP 119/80 (BP Location: Left Arm, Patient Position: Sitting, Cuff Size: Large)   Pulse 70   Wt 265 lb (120.2 kg)   SpO2 97%   BMI 40.29 kg/m   Constitutional:   Well nourished. Alert and oriented, No acute distress. HEENT: [place] AT, moist mucus membranes.  Trachea midline Cardiovascular: No clubbing, cyanosis, or edema. Respiratory: Normal respiratory effort, no increased work of breathing. Neurologic: Grossly intact, no focal deficits, moving all 4 extremities. Psychiatric: Normal mood and affect.    Laboratory Data: See Epic and HPI   I have reviewed the labs.   Pertinent Imaging: CLINICAL DATA:  Abdominal/flank pain, stone suspected. Right-sided pain.   EXAM: CT ABDOMEN AND PELVIS WITHOUT CONTRAST   TECHNIQUE: Multidetector CT imaging of the abdomen and pelvis was performed following the standard protocol without IV contrast.   RADIATION DOSE REDUCTION: This exam was performed according to  the departmental dose-optimization program which includes automated exposure control, adjustment of the mA and/or kV according to patient size and/or use of iterative reconstruction technique.   COMPARISON:  09/19/2023   FINDINGS: Lower chest: Lung bases are clear.   Hepatobiliary: Normal appearance of the liver. Multiple layering gallstones. No gallbladder distension. No inflammatory changes around the gallbladder.   Pancreas: Unremarkable. No pancreatic ductal dilatation or surrounding inflammatory changes.   Spleen: Spleen is prominent for size measuring 12.4 cm in the craniocaudal dimension and stable from the previous examination.   Adrenals/Urinary Tract: Normal adrenal glands. Moderate right hydronephrosis. 3 mm stone in the right kidney lower pole. Mild right perinephric stranding. Dilatation of the proximal and mid right ureter with a 6 mm stone in the mid/distal right ureter. Normal appearance of the urinary bladder. Tiny stone in the left kidney lower pole. Mild fullness of the left renal pelvis without left hydronephrosis.   Stomach/Bowel: Probable small hiatal hernia. Otherwise, normal appearance of stomach. No bowel  dilatation or obstruction. No focal bowel inflammation.   Vascular/Lymphatic: Aortic atherosclerosis. No enlarged abdominal or pelvic lymph nodes.   Reproductive: Uterus and bilateral adnexa are unremarkable.   Other: Stranding around the proximal right ureter and right retroperitoneal structures. These findings are related to the obstructing right ureter stone. Negative for free fluid. Negative for free air. Tiny umbilical hernia containing fat.   Musculoskeletal: Mild disc space narrowing at L5-S1. No acute bone abnormality.   IMPRESSION: 1. Moderate right hydroureteronephrosis secondary to a 6 mm stone in the mid/distal right ureter. 2. Bilateral renal calculi. 3. Cholelithiasis without evidence for acute cholecystitis. 4. Aortic Atherosclerosis (ICD10-I70.0).     Electronically Signed   By: Juliene Balder M.D.   On: 01/14/2024 13:26  KUB, radiologist interpretation still pending I have independently reviewed the films.  See HPI.    Assessment and Plan:  1. Right ureteral stone - explained that her stone was not visible on KUB, so we cannot pursue ESWL and we will need to treat this stone via ureteroscopy with laser lithotripsy with stent placement -Patient counseled on operative technique and associated risks. Informed of planned ureteral stent placement, expected to remain in situ for 3-10 days, with potential for stent-related symptoms including flank pain, bladder discomfort, dysuria, urgency, frequency, urinary incontinence, and gross hematuria. -Stent removal options reviewed--either via office cystoscopy or self-removal via extraction string, to be determined intraoperatively. -Residual nephrolithiasis or ureterolithiasis may persist post-procedure, potentially requiring future intervention. -Intraoperative risks include ureteral injury, which may necessitate open surgical repair. Additional risks include hemorrhage, infection, and complications related to general  anesthesia (e.g., MI, CVA, paralysis, coma, death). -Patient advised to contact clinic or present to ED for fever, uncontrolled pain, or intractable emesis to facilitate urgent evaluation and management. - UA w/ pyuria, hematuria, and bacteria - urine sent for culture   2. Left renal stone  - successfully treated with ESWL  3. Microscopic hematuria - UA > 30 RBC's  - will need to recheck after definitive treatment for stone  Return for right URS/LL/ureteral stent placement.  These notes generated with voice recognition software. I apologize for typographical errors.  Theresa Thomas  [facility] [address]  [address] [place], [place] [address] [phone]  Surgical Physician Order [facility] [place]  * Scheduling expectation : 01/23/2024 with Dr. Twylla   *Length of Case:   *Clearance needed: no  *Anticoagulation Instructions: N/A  *Aspirin Instructions: N/A  *Post-op visit Date/Instructions:  TBD  *Diagnosis: Right Ureteral Stone  *Procedure: right Ureteroscopy w/laser lithotripsy & stent placement (47643)   Additional orders: N/A  -Admit type: OUTpatient  -Anesthesia: General  -VTE Prophylaxis Standing Order SCDs       Other:   -Standing Lab Orders Per Anesthesia    Lab other: None  -Standing Test orders EKG/Chest x-ray per Anesthesia       Test other:   - Medications:  Ancef  2gm IV  -Other orders:  N/A

## 2024-01-14 ENCOUNTER — Emergency Department

## 2024-01-14 ENCOUNTER — Telehealth: Payer: Self-pay

## 2024-01-14 ENCOUNTER — Emergency Department: Admission: EM | Admit: 2024-01-14 | Discharge: 2024-01-14 | Disposition: A | Discharge status: Home / Self Care

## 2024-01-14 ENCOUNTER — Other Ambulatory Visit: Payer: Self-pay

## 2024-01-14 DIAGNOSIS — N201 Calculus of ureter: Secondary | ICD-10-CM

## 2024-01-14 LAB — CBC
HCT: 37.9 % (ref 36.0–46.0)
Hemoglobin: 13.4 g/dL (ref 12.0–15.0)
MCH: 28.5 pg (ref 26.0–34.0)
MCHC: 35.4 g/dL (ref 30.0–36.0)
MCV: 80.6 fL (ref 80.0–100.0)
Platelets: 202 K/uL (ref 150–400)
RBC: 4.7 MIL/uL (ref 3.87–5.11)
RDW: 12.5 % (ref 11.5–15.5)
WBC: 6.1 K/uL (ref 4.0–10.5)
nRBC: 0 % (ref 0.0–0.2)

## 2024-01-14 LAB — URINALYSIS, ROUTINE W REFLEX MICROSCOPIC
Bacteria, UA: NONE SEEN
Bilirubin Urine: NEGATIVE
Glucose, UA: NEGATIVE mg/dL
Ketones, ur: NEGATIVE mg/dL
Nitrite: NEGATIVE
Protein, ur: NEGATIVE mg/dL
Specific Gravity, Urine: 1.026 (ref 1.005–1.030)
pH: 5 (ref 5.0–8.0)

## 2024-01-14 LAB — BASIC METABOLIC PANEL WITH GFR
Anion gap: 12 (ref 5–15)
BUN: 21 mg/dL (ref 8–23)
CO2: 23 mmol/L (ref 22–32)
Calcium: 9.6 mg/dL (ref 8.9–10.3)
Chloride: 104 mmol/L (ref 98–111)
Creatinine, Ser: 1.2 mg/dL — ABNORMAL HIGH (ref 0.44–1.00)
GFR, Estimated: 51 mL/min — ABNORMAL LOW (ref >60)
Glucose, Bld: 127 mg/dL — ABNORMAL HIGH (ref 70–99)
Potassium: 4.2 mmol/L (ref 3.5–5.1)
Sodium: 139 mmol/L (ref 135–145)

## 2024-01-14 MED ORDER — HYDROCODONE-ACETAMINOPHEN 5-325 MG PO TABS: 1.0000 | ORAL_TABLET | Freq: Three times a day (TID) | ORAL | 0 refills | Status: AC | PRN

## 2024-01-14 MED ORDER — TAMSULOSIN HCL 0.4 MG PO CAPS
0.4000 mg | ORAL_CAPSULE | Freq: Once | ORAL | Status: AC
  Administered 2024-01-14: 0.4 mg via ORAL
  Filled 2024-01-14: qty 1

## 2024-01-14 MED ORDER — TAMSULOSIN HCL 0.4 MG PO CAPS: 0.4000 mg | ORAL_CAPSULE | Freq: Every day | ORAL | 0 refills | Status: DC

## 2024-01-14 NOTE — Telephone Encounter (Signed)
 Pt called the triage line stating she is miserable, having right pain on her side, vomiting, dark bloody urine and discomfort. Pt states symptoms have been going on since Thursday, states she last vomited yesterday morning. Pt states she took some expired tramadol  that she had since her knee surgery 2 years ago. Pt was advise to go to the ER for appropriate care for symptoms. Pt was informed that [place], [place] and on call doctor also agree that she should go to the ER, pt voiced understanding.

## 2024-01-14 NOTE — ED Triage Notes (Signed)
 Pt comes with flank pain. Pt has hx of kidney stones. Pt states this started on Thursday. Pt states right sided pain. Pt states no pain wit urination but dark and bloody.  Pt states vomiting

## 2024-01-14 NOTE — Discharge Instructions (Signed)
 Take the prescription meds as directed. Drink plenty of (non-carbonated) fluids like lemonade, sports drinks, or cranberry juice. Follow-up with your Urologist as needed.

## 2024-01-14 NOTE — ED Notes (Signed)
Verified correct patient and correct discharge papers given. Pt alert and oriented X 4, stable for discharge. RR even and unlabored, color WNL. Discussed discharge instructions and follow-up as directed. Discharge medications discussed, when prescribed. Pt had opportunity to ask questions, and RN available to provide patient and/or family education.   

## 2024-01-14 NOTE — ED Provider Notes (Signed)
 [facility] Emergency Department Provider Note     Event Date/Time   First MD Initiated Contact with Patient 01/14/24 1131     (approximate)   History   Flank Pain   HPI  Theresa Thomas is a 63 y.o. female with a history of kidney stones, HTN, OSA, thrice, presents to the ED with flank pain.  She reports onset on Thursday, 4 days prior to arrival.  She does endorse dark and presumably bloody urine.  She has also had some intermittent vomiting.     Physical Exam   Triage Vital Signs: ED Triage Vitals  Encounter Vitals Group     BP 01/14/24 1056 118/76     Girls Systolic BP Percentile --      Girls Diastolic BP Percentile --      Boys Systolic BP Percentile --      Boys Diastolic BP Percentile --      Pulse Rate 01/14/24 1056 69     Resp 01/14/24 1056 18     Temp 01/14/24 1056 98.2 F (36.8 C)     Temp src --      SpO2 01/14/24 1056 98 %     Weight 01/14/24 1054 265 lb (120.2 kg)     Height 01/14/24 1054 5' 8 (1.727 m)     Head Circumference --      Peak Flow --      Pain Score 01/14/24 1054 2     Pain Loc --      Pain Education --      Exclude from Growth Chart --     Most recent vital signs: Vitals:   01/14/24 1056 01/14/24 1401  BP: 118/76 126/71  Pulse: 69 61  Resp: 18 16  Temp: 98.2 F (36.8 C) 98 F (36.7 C)  SpO2: 98% 98%    General Awake, no distress.  NAD HEENT NCAT. PERRL. EOMI. No rhinorrhea. Mucous membranes are moist.  CV:  Good peripheral perfusion.  RRR RESP:  Normal effort.  CTA ABD:  No distention.  Soft and nontender.  No rebound, guarding, or rigidity noted.  Mild right flank tenderness elicited.    ED Results / Procedures / Treatments   Labs (all labs ordered are listed, but only abnormal results are displayed) Labs Reviewed  URINALYSIS, ROUTINE W REFLEX MICROSCOPIC - Abnormal; Notable for the following components:      Result Value   Color, Urine YELLOW (*)    APPearance CLEAR (*)    Hgb urine  dipstick MODERATE (*)    Leukocytes,Ua MODERATE (*)    All other components within normal limits  BASIC METABOLIC PANEL WITH GFR - Abnormal; Notable for the following components:   Glucose, Bld 127 (*)    Creatinine, Ser 1.20 (*)    GFR, Estimated 51 (*)    All other components within normal limits  CBC     EKG   RADIOLOGY  I personally viewed and evaluated these images as part of my medical decision making, as well as reviewing the written report by the radiologist.  ED Provider Interpretation: 6 mL stone to the mid to distal right ureter with some mild hydronephrosis  CT Renal Stone Study Result Date: 01/14/2024 CLINICAL DATA:  Abdominal/flank pain, stone suspected. Right-sided pain. EXAM: CT ABDOMEN AND PELVIS WITHOUT CONTRAST TECHNIQUE: Multidetector CT imaging of the abdomen and pelvis was performed following the standard protocol without IV contrast. RADIATION DOSE REDUCTION: This exam was performed according to the  departmental dose-optimization program which includes automated exposure control, adjustment of the mA and/or kV according to patient size and/or use of iterative reconstruction technique. COMPARISON:  09/19/2023 FINDINGS: Lower chest: Lung bases are clear. Hepatobiliary: Normal appearance of the liver. Multiple layering gallstones. No gallbladder distension. No inflammatory changes around the gallbladder. Pancreas: Unremarkable. No pancreatic ductal dilatation or surrounding inflammatory changes. Spleen: Spleen is prominent for size measuring 12.4 cm in the craniocaudal dimension and stable from the previous examination. Adrenals/Urinary Tract: Normal adrenal glands. Moderate right hydronephrosis. 3 mm stone in the right kidney lower pole. Mild right perinephric stranding. Dilatation of the proximal and mid right ureter with a 6 mm stone in the mid/distal right ureter. Normal appearance of the urinary bladder. Tiny stone in the left kidney lower pole. Mild fullness of the  left renal pelvis without left hydronephrosis. Stomach/Bowel: Probable small hiatal hernia. Otherwise, normal appearance of stomach. No bowel dilatation or obstruction. No focal bowel inflammation. Vascular/Lymphatic: Aortic atherosclerosis. No enlarged abdominal or pelvic lymph nodes. Reproductive: Uterus and bilateral adnexa are unremarkable. Other: Stranding around the proximal right ureter and right retroperitoneal structures. These findings are related to the obstructing right ureter stone. Negative for free fluid. Negative for free air. Tiny umbilical hernia containing fat. Musculoskeletal: Mild disc space narrowing at L5-S1. No acute bone abnormality. IMPRESSION: 1. Moderate right hydroureteronephrosis secondary to a 6 mm stone in the mid/distal right ureter. 2. Bilateral renal calculi. 3. Cholelithiasis without evidence for acute cholecystitis. 4. Aortic Atherosclerosis (ICD10-I70.0). Electronically Signed   By: Juliene Balder M.D.   On: 01/14/2024 13:26     PROCEDURES:  Critical Care performed: No  Procedures   MEDICATIONS ORDERED IN ED: Medications  tamsulosin  (FLOMAX ) capsule 0.4 mg (0.4 mg Oral Given 01/14/24 1400)     IMPRESSION / MDM / ASSESSMENT AND PLAN / ED COURSE  I reviewed the triage vital signs and the nursing notes.                              Differential diagnosis includes, but is not limited to,  ovarian cyst, ovarian torsion, acute appendicitis, diverticulitis, urinary tract infection/pyelonephritis, endometriosis, bowel obstruction, colitis, renal colic, gastroenteritis, hernia, fibroids, etc.  Patient's presentation is most consistent with acute complicated illness / injury requiring diagnostic workup.  Patient's diagnosis is consistent with ureterolithiasis.  Patient with a history of kidney stones and recent left lithotripsy, presenting with right flank pain.  Patient with endorsed Avril days of symptoms as well as dark-colored urine.  Her labs overall reassuring  with no significant abnormalities noted.  Very minimal AKI secondary to hydronephrosis which is confirmed on the CT image interpreted by me, showing a 6 mm stone in the mid to distal right ureter.  No UA evidence of acute bacteriuria.  Patient endorsing minimal pain at this time, will be discharged with pain medicine.  Patient stable for outpatient management.  Patient will be discharged home with prescriptions for tamsulosin  and hydrocodone  (#9). Patient is to follow up with urology as discussed, as needed or otherwise directed. Patient is given ED precautions to return to the ED for any worsening or new symptoms.    FINAL CLINICAL IMPRESSION(S) / ED DIAGNOSES   Final diagnoses:  Ureterolithiasis     Rx / DC Orders   ED Discharge Orders          Ordered    tamsulosin  (FLOMAX ) 0.4 MG CAPS capsule  Daily  01/14/24 1355    HYDROcodone -acetaminophen  (NORCO/VICODIN) 5-325 MG tablet  3 times daily PRN        01/14/24 1355             Note:  This document was prepared using Dragon voice recognition software and may include unintentional dictation errors.    Loyd Candida LULLA Aldona, PA-C 01/14/24 1411    Clarine Ozell LABOR, MD 01/15/24 [phone]

## 2024-01-17 ENCOUNTER — Ambulatory Visit
Admission: RE | Admit: 2024-01-17 | Discharge: 2024-01-17 | Disposition: A | Discharge status: Home / Self Care | Attending: Urology | Admitting: Urology

## 2024-01-17 ENCOUNTER — Ambulatory Visit
Admission: RE | Admit: 2024-01-17 | Discharge: 2024-01-17 | Disposition: A | Discharge status: Home / Self Care | Source: Ambulatory Visit | Attending: Urology | Admitting: Urology

## 2024-01-17 ENCOUNTER — Ambulatory Visit: Admitting: Urology

## 2024-01-17 VITALS — BP 119/80 | HR 70 | Wt 265.0 lb

## 2024-01-17 DIAGNOSIS — N200 Calculus of kidney: Secondary | ICD-10-CM

## 2024-01-17 DIAGNOSIS — R3129 Other microscopic hematuria: Secondary | ICD-10-CM | POA: Diagnosis not present

## 2024-01-17 DIAGNOSIS — N201 Calculus of ureter: Secondary | ICD-10-CM

## 2024-01-17 LAB — URINALYSIS, COMPLETE
Bilirubin, UA: NEGATIVE
Glucose, UA: NEGATIVE
Ketones, UA: NEGATIVE
Nitrite, UA: NEGATIVE
Specific Gravity, UA: 1.02 (ref 1.005–1.030)
Urobilinogen, Ur: 0.2 mg/dL (ref 0.2–1.0)
pH, UA: 6 (ref 5.0–7.5)

## 2024-01-17 LAB — MICROSCOPIC EXAMINATION: RBC, Urine: >30 /HPF — AB (ref 0–2)

## 2024-01-18 ENCOUNTER — Other Ambulatory Visit: Payer: Self-pay

## 2024-01-18 ENCOUNTER — Telehealth: Payer: Self-pay

## 2024-01-18 DIAGNOSIS — N201 Calculus of ureter: Secondary | ICD-10-CM

## 2024-01-18 NOTE — Telephone Encounter (Signed)
 Per Dr. Twylla, MD Patient is to be scheduled for Right Ureteroscopy with Laser Lithotripsy and Stent Placement   Theresa Thomas was contacted and possible surgical dates were discussed, Tuesday December 16th, 2025 was agreed upon for surgery.   Patient was directed to call [phone] between 1-3pm the day before surgery to find out surgical arrival time.  Instructions were given not to eat or drink from midnight on the night before surgery and have a driver for the day of surgery. On the surgery day patient was instructed to enter through the [facility] entrance of [facility] report the Same Day Surgery desk.   Pre-Admit Testing will be in contact via phone to set up an interview with the anesthesia team to review your history and medications prior to surgery.   Reminder of this information was sent via MyChart to the patient.

## 2024-01-18 NOTE — Progress Notes (Signed)
 Surgical Physician Order Form [facility] [place]  * Scheduling expectation :  01/23/2024 with Dr. Twylla   *Length of Case:   *Clearance needed: no  *Anticoagulation Instructions: N/A  *Aspirin Instructions: N/A  *Post-op visit Date/Instructions:   TBD  *Diagnosis: Right Ureteral Stone  *Procedure: right Ureteroscopy w/laser lithotripsy & stent placement (47643)   Additional orders: N/A  -Admit type: OUTpatient  -Anesthesia: General  -VTE Prophylaxis Standing Order SCD's       Other:   -Standing Lab Orders Per Anesthesia    Lab other: None  -Standing Test orders EKG/Chest x-ray per Anesthesia       Test other:   - Medications:  Ancef  2gm IV  -Other orders:  N/A

## 2024-01-18 NOTE — Progress Notes (Signed)
° °  [facility] Surgical Posting Form  Surgery Date: Date: 01/22/2024  Surgeon: Dr. Glendia Barba, MD  Inpt ( No  )   Outpt (Yes)   Obs ( No  )   Diagnosis: N20.1 Right Ureteral Stone  -CPT: [phone]  Surgery: Right Ureteroscopy with Laser Lithotripsy and Stent Placement  Stop Anticoagulations: No, may continue all  Cardiac/Medical/Pulmonary Clearance needed: no  *Orders entered into EPIC  Date: 01/18/2024   *Case booked in [place]  Date: 01/18/2024  *Notified pt of Surgery: Date: 01/18/2024  PRE-OP UA & CX: no  *Placed into Prior Authorization Work [place] Date: 01/18/2024  Assistant/laser/rep:No

## 2024-01-19 ENCOUNTER — Encounter: Payer: Self-pay | Admitting: Urology

## 2024-01-21 ENCOUNTER — Telehealth: Payer: Self-pay | Admitting: Urology

## 2024-01-21 DIAGNOSIS — N201 Calculus of ureter: Secondary | ICD-10-CM

## 2024-01-21 MED ORDER — AMOXICILLIN-POT CLAVULANATE 875-125 MG PO TABS: 1.0000 | ORAL_TABLET | Freq: Two times a day (BID) | ORAL | 0 refills | Status: DC

## 2024-01-21 NOTE — Telephone Encounter (Signed)
 Spoke with patient in regards to note below. Advised per CANDIE Cornwall PA , Augmentin  should be started today , while we are awaiting urine culture. Patient verbalized understanding of information given.  Andrea Kirks LPN

## 2024-01-21 NOTE — Telephone Encounter (Signed)
 Would you let Theresa Thomas know that I sent in a script for Augmentin  and to start that today?  Her urine culture is still pending and it is growing out a small amount of bacteria, but I want her to start the antibiotic in an abundance of caution.

## 2024-01-22 ENCOUNTER — Ambulatory Visit: Payer: Self-pay | Admitting: Urgent Care

## 2024-01-22 ENCOUNTER — Encounter: Admission: RE | Disposition: A | Payer: Self-pay | Source: Home / Self Care | Attending: Urology

## 2024-01-22 ENCOUNTER — Ambulatory Visit: Admission: RE | Admit: 2024-01-22 | Discharge: 2024-01-22 | Disposition: A | Attending: Urology | Admitting: Urology

## 2024-01-22 ENCOUNTER — Encounter: Payer: Self-pay | Admitting: Urology

## 2024-01-22 ENCOUNTER — Other Ambulatory Visit: Payer: Self-pay

## 2024-01-22 ENCOUNTER — Encounter: Payer: Self-pay | Admitting: Urgent Care

## 2024-01-22 ENCOUNTER — Ambulatory Visit

## 2024-01-22 DIAGNOSIS — N202 Calculus of kidney with calculus of ureter: Secondary | ICD-10-CM | POA: Diagnosis not present

## 2024-01-22 DIAGNOSIS — R3121 Asymptomatic microscopic hematuria: Secondary | ICD-10-CM

## 2024-01-22 DIAGNOSIS — N132 Hydronephrosis with renal and ureteral calculous obstruction: Secondary | ICD-10-CM | POA: Diagnosis not present

## 2024-01-22 DIAGNOSIS — N201 Calculus of ureter: Secondary | ICD-10-CM

## 2024-01-22 DIAGNOSIS — I10 Essential (primary) hypertension: Secondary | ICD-10-CM | POA: Insufficient documentation

## 2024-01-22 DIAGNOSIS — E039 Hypothyroidism, unspecified: Secondary | ICD-10-CM | POA: Insufficient documentation

## 2024-01-22 DIAGNOSIS — N200 Calculus of kidney: Secondary | ICD-10-CM

## 2024-01-22 HISTORY — PX: CYSTOSCOPY/URETEROSCOPY/HOLMIUM LASER/STENT PLACEMENT: SHX6546

## 2024-01-22 LAB — CULTURE, URINE COMPREHENSIVE

## 2024-01-22 SURGERY — CYSTOSCOPY/URETEROSCOPY/HOLMIUM LASER/STENT PLACEMENT
Anesthesia: General | Laterality: Right

## 2024-01-22 MED ORDER — CHLORHEXIDINE GLUCONATE 0.12 % MT SOLN
15.0000 mL | Freq: Once | OROMUCOSAL | Status: AC
  Administered 2024-01-22: 15:00:00 15 mL via OROMUCOSAL

## 2024-01-22 MED ORDER — FENTANYL CITRATE (PF) 100 MCG/2ML IJ SOLN: 25.0000 ug | INTRAMUSCULAR | Status: DC | PRN

## 2024-01-22 MED ORDER — MIDAZOLAM HCL (PF) 2 MG/2ML IJ SOLN
INTRAMUSCULAR | Status: DC | PRN
  Administered 2024-01-22: 15:00:00 2 mg via INTRAVENOUS

## 2024-01-22 MED ORDER — IOHEXOL 180 MG/ML  SOLN
INTRAMUSCULAR | Status: DC | PRN
  Administered 2024-01-22: 16:00:00 20 mL

## 2024-01-22 MED ORDER — CEFAZOLIN SODIUM-DEXTROSE 2-4 GM/100ML-% IV SOLN
2.0000 g | INTRAVENOUS | Status: AC
  Administered 2024-01-22: 16:00:00 2 g via INTRAVENOUS

## 2024-01-22 MED ORDER — ONDANSETRON HCL 4 MG/2ML IJ SOLN
INTRAMUSCULAR | Status: DC | PRN
  Administered 2024-01-22: 16:00:00 4 mg via INTRAVENOUS

## 2024-01-22 MED ORDER — PHENYLEPHRINE 80 MCG/ML (10ML) SYRINGE FOR IV PUSH (FOR BLOOD PRESSURE SUPPORT)
PREFILLED_SYRINGE | INTRAVENOUS | Status: DC | PRN
  Administered 2024-01-22 (×2): 80 ug via INTRAVENOUS
  Administered 2024-01-22: 16:00:00 160 ug via INTRAVENOUS
  Administered 2024-01-22: 16:00:00 80 ug via INTRAVENOUS

## 2024-01-22 MED ORDER — FENTANYL CITRATE (PF) 100 MCG/2ML IJ SOLN
INTRAMUSCULAR | Status: AC
  Filled 2024-01-22: qty 2

## 2024-01-22 MED ORDER — SUGAMMADEX SODIUM 200 MG/2ML IV SOLN
INTRAVENOUS | Status: DC | PRN
  Administered 2024-01-22: 16:00:00 200 mg via INTRAVENOUS

## 2024-01-22 MED ORDER — SODIUM CHLORIDE 0.9 % IR SOLN
Status: DC | PRN
  Administered 2024-01-22: 16:00:00 3000 mL

## 2024-01-22 MED ORDER — ORAL CARE MOUTH RINSE: 15.0000 mL | Freq: Once | OROMUCOSAL | Status: AC

## 2024-01-22 MED ORDER — FENTANYL CITRATE (PF) 100 MCG/2ML IJ SOLN
INTRAMUSCULAR | Status: DC | PRN
  Administered 2024-01-22: 15:00:00 100 ug via INTRAVENOUS

## 2024-01-22 MED ORDER — OXYCODONE HCL 5 MG/5ML PO SOLN: 5.0000 mg | Freq: Once | ORAL | Status: DC | PRN

## 2024-01-22 MED ORDER — OXYBUTYNIN CHLORIDE 5 MG PO TABS: ORAL_TABLET | ORAL | 0 refills | Status: DC

## 2024-01-22 MED ORDER — DEXMEDETOMIDINE HCL IN NACL 80 MCG/20ML IV SOLN
INTRAVENOUS | Status: DC | PRN
  Administered 2024-01-22: 16:00:00 8 ug via INTRAVENOUS
  Administered 2024-01-22: 15:00:00 12 ug via INTRAVENOUS

## 2024-01-22 MED ORDER — MIDAZOLAM HCL 2 MG/2ML IJ SOLN
INTRAMUSCULAR | Status: AC
  Filled 2024-01-22: qty 2

## 2024-01-22 MED ORDER — PROPOFOL 10 MG/ML IV BOLUS
INTRAVENOUS | Status: DC | PRN
  Administered 2024-01-22: 15:00:00 170 mg via INTRAVENOUS

## 2024-01-22 MED ORDER — LACTATED RINGERS IV SOLN: INTRAVENOUS | Status: DC

## 2024-01-22 MED ORDER — HYDROCODONE-ACETAMINOPHEN 5-325 MG PO TABS: 1.0000 | ORAL_TABLET | Freq: Four times a day (QID) | ORAL | 0 refills | Status: DC | PRN

## 2024-01-22 MED ORDER — CEFAZOLIN SODIUM-DEXTROSE 2-4 GM/100ML-% IV SOLN
INTRAVENOUS | Status: AC
  Filled 2024-01-22: qty 100

## 2024-01-22 MED ORDER — CHLORHEXIDINE GLUCONATE 0.12 % MT SOLN
OROMUCOSAL | Status: AC
  Filled 2024-01-22: qty 15

## 2024-01-22 MED ORDER — ROCURONIUM BROMIDE 100 MG/10ML IV SOLN
INTRAVENOUS | Status: DC | PRN
  Administered 2024-01-22: 15:00:00 50 mg via INTRAVENOUS
  Administered 2024-01-22: 16:00:00 10 mg via INTRAVENOUS

## 2024-01-22 MED ORDER — OXYCODONE HCL 5 MG PO TABS: 5.0000 mg | ORAL_TABLET | Freq: Once | ORAL | Status: DC | PRN

## 2024-01-22 SURGICAL SUPPLY — 24 items
BAG DRAIN SIEMENS DORNER NS (MISCELLANEOUS) ×1 IMPLANT
BASKET LASER NITINOL 1.9FR (BASKET) IMPLANT
BASKET ZERO TIP 1.9FR (BASKET) IMPLANT
BRUSH SCRUB EZ 4% CHG (MISCELLANEOUS) ×1 IMPLANT
CATH URET FLEX-TIP 2 LUMEN 10F (CATHETERS) IMPLANT
CATH URETL OPEN END 6X70 (CATHETERS) IMPLANT
DRAPE UTILITY 15X26 TOWEL STRL (DRAPES) ×1 IMPLANT
FIBER LASER MOSES 200 DFL (Laser) IMPLANT
GLOVE BIOGEL PI IND STRL 7.5 (GLOVE) ×1 IMPLANT
GOWN STRL REUS W/ TWL LRG LVL3 (GOWN DISPOSABLE) ×1 IMPLANT
GOWN STRL REUS W/ TWL XL LVL3 (GOWN DISPOSABLE) ×1 IMPLANT
GUIDEWIRE STR DUAL SENSOR (WIRE) ×1 IMPLANT
KIT TURNOVER CYSTO (KITS) ×1 IMPLANT
PACK CYSTO AR (MISCELLANEOUS) ×1 IMPLANT
SHEATH NAVIGATOR HD 12/14X36 (SHEATH) IMPLANT
SOL .9 NS 3000ML IRR UROMATIC (IV SOLUTION) ×1 IMPLANT
SOLN STERILE WATER 500 ML (IV SOLUTION) ×1 IMPLANT
STENT URET 6FRX24 CONTOUR (STENTS) IMPLANT
STENT URET 6FRX26 CONTOUR (STENTS) IMPLANT
SURGILUBE 2OZ TUBE FLIPTOP (MISCELLANEOUS) ×1 IMPLANT
SYR 30ML LL (SYRINGE) IMPLANT
SYRINGE TOOMEY IRRIG 70ML (MISCELLANEOUS) IMPLANT
TUBING THERMACLEAR UROLOGY (TUBING) ×1 IMPLANT
VALVE UROSEAL ADJ ENDO (VALVE) IMPLANT

## 2024-01-22 NOTE — Anesthesia Procedure Notes (Signed)
 Procedure Name: Intubation Date/Time: 01/22/2024 3:27 PM  Performed by: Rosine Shona Jansky, CRNAPre-anesthesia Checklist: Patient identified, Emergency Drugs available, Suction available and Patient being monitored Patient Re-evaluated:Patient Re-evaluated prior to induction Oxygen Delivery Method: Circle system utilized Preoxygenation: Pre-oxygenation with 100% oxygen Induction Type: IV induction Ventilation: Mask ventilation without difficulty Laryngoscope Size: McGrath and 3 Grade View: Grade I Tube type: Oral Tube size: 7.0 mm Number of attempts: 1 Airway Equipment and Method: Stylet and Oral airway Placement Confirmation: ETT inserted through vocal cords under direct vision, positive ETCO2 and breath sounds checked- equal and bilateral Secured at: 22 cm Tube secured with: Tape Dental Injury: Teeth and Oropharynx as per pre-operative assessment

## 2024-01-22 NOTE — Interval H&P Note (Signed)
 History and Physical Interval Note:  01/22/2024 3:21 PM  Theresa Thomas  has presented today for surgery, with the diagnosis of Right Ureteral Stone.  The various methods of treatment have been discussed with the patient and family. After consideration of risks, benefits and other options for treatment, the patient has consented to  Procedures: CYSTOSCOPY/URETEROSCOPY/HOLMIUM LASER/STENT PLACEMENT (Right) as a surgical intervention.  The patient's history has been reviewed, patient examined, no change in status, stable for surgery.  I have reviewed the patient's chart and labs.  Questions were answered to the patient's satisfaction.    CV:RRR Lungs:clear  Theresa Thomas

## 2024-01-22 NOTE — Transfer of Care (Signed)
 Immediate Anesthesia Transfer of Care Note  Patient: Theresa Thomas  Procedure(s) Performed: CYSTOSCOPY/URETEROSCOPY/HOLMIUM LASER/STENT PLACEMENT (Right)  Patient Location: PACU  Anesthesia Type:General  Level of Consciousness: drowsy  Airway & Oxygen Therapy: Patient Spontanous Breathing and Patient connected to face mask oxygen  Post-op Assessment: Report given to RN  Post vital signs: stable  Last Vitals:  Vitals Value Taken Time  BP 120/72 01/22/24 16:37  Temp    Pulse 70 01/22/24 16:39  Resp 22 01/22/24 16:39  SpO2 99 % 01/22/24 16:39  Vitals shown include unfiled device data.  Last Pain:  Vitals:   01/22/24 1433  TempSrc: Temporal  PainSc: 0-No pain         Complications: No notable events documented.

## 2024-01-22 NOTE — Anesthesia Preprocedure Evaluation (Signed)
 Anesthesia Evaluation  Patient identified by MRN, date of birth, ID band Patient awake    Reviewed: Allergy & Precautions, NPO status , Patient's Chart, lab work & pertinent test results  History of Anesthesia Complications Negative for: history of anesthetic complications  Airway Mallampati: III  TM Distance: >3 FB Neck ROM: full    Dental  (+) Chipped   Pulmonary neg shortness of breath, sleep apnea    Pulmonary exam normal        Cardiovascular Exercise Tolerance: Good hypertension, Normal cardiovascular exam     Neuro/Psych  Headaches  negative psych ROS   GI/Hepatic negative GI ROS, Neg liver ROS,neg GERD  ,,  Endo/Other  Hypothyroidism    Renal/GU Renal disease     Musculoskeletal   Abdominal   Peds  Hematology negative hematology ROS (+)   Anesthesia Other Findings Past Medical History: No date: Arthritis No date: Family history of adverse reaction to anesthesia     Comment:  sister has PONv No date: Headache     Comment:  MIGRAINES No date: History of kidney stones No date: Hypertension No date: Hypothyroidism No date: Sleep apnea     Comment:  CPAP 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   Past Surgical History: 10/26/2014: COLONOSCOPY WITH PROPOFOL ; N/A     Comment:  Procedure: COLONOSCOPY WITH PROPOFOL ;  Surgeon: Lamar ONEIDA Holmes, MD;  Location: [facility] ENDOSCOPY;  Service:               Endoscopy;  Laterality: N/A; 02/24/2021: COLONOSCOPY WITH PROPOFOL ; N/A     Comment:  Procedure: COLONOSCOPY WITH PROPOFOL ;  Surgeon: Jinny Carmine, MD;  Location: [facility] ENDOSCOPY;  Service:               Endoscopy;  Laterality: N/A; 10/25/2023: EXTRACORPOREAL SHOCK WAVE LITHOTRIPSY; Left     Comment:  Procedure: LITHOTRIPSY, ESWL;  Surgeon: Twylla Glendia BROCKS, MD;  Location: [facility] ORS;  Service: Urology;                Laterality: Left; 06/01/2021: KNEE  ARTHROPLASTY; Right     Comment:  Procedure: COMPUTER ASSISTED TOTAL KNEE ARTHROPLASTY;                Surgeon: Mardee Lynwood SQUIBB, MD;  Location: [facility] ORS;                Service: Orthopedics;  Laterality: Right; 01/20/2016: KNEE ARTHROSCOPY; Left     Comment:  Procedure: ARTHROSCOPY KNEE, PARTIAL MEDIAL               MENISCECTOMY, CHONDROPLASTY;  Surgeon: Helayne Glenn,               MD;  Location: [facility] ORS;  Service: Orthopedics;                Laterality: Left; 2019: REPLACEMENT TOTAL KNEE; Left  BMI    Body Mass Index: 40.29 kg/m      Reproductive/Obstetrics negative OB ROS                              Anesthesia Physical Anesthesia Plan  ASA: 3  Anesthesia Plan: General ETT   Post-op Pain Management:  Induction: Intravenous  PONV Risk Score and Plan: Ondansetron , Dexamethasone , Midazolam  and Treatment may vary due to age or medical condition  Airway Management Planned: Oral ETT  Additional Equipment:   Intra-op Plan:   Post-operative Plan: Extubation in OR  Informed Consent: I have reviewed the patients History and Physical, chart, labs and discussed the procedure including the risks, benefits and alternatives for the proposed anesthesia with the patient or authorized representative who has indicated his/her understanding and acceptance.     Dental Advisory Given  Plan Discussed with: Anesthesiologist, CRNA and Surgeon  Anesthesia Plan Comments: (Patient consented for risks of anesthesia including but not limited to:  - adverse reactions to medications - damage to eyes, teeth, lips or other oral mucosa - nerve damage due to positioning  - sore throat or hoarseness - Damage to heart, brain, nerves, lungs, other parts of body or loss of life  Patient voiced understanding and assent.)        Anesthesia Quick Evaluation

## 2024-01-22 NOTE — Discharge Instructions (Signed)
 DISCHARGE INSTRUCTIONS FOR KIDNEY STONE/URETERAL STENT   MEDICATIONS:  1. Resume all your other meds from home.  2.  AZO (over-the-counter) can help with the burning/stinging when you urinate. 3.  Hydrocodone  is for moderate/severe pain, Rx was sent to your pharmacy. 4.  Oxybutynin  is for bladder/stent irritation, Rx was sent to your pharmacy 5.  Continue tamsulosin  which will also help with bladder/stent discomfort  ACTIVITY:  1. May resume regular activities in 24 hours. 2. No driving while on narcotic pain medications  3. Drink plenty of water   BATHING:  1. You can shower. 2. You have a string coming from your urethra: The stent string is attached to your ureteral stent. Do not pull on this.   SIGNS/SYMPTOMS TO CALL:  Common postoperative symptoms include urinary frequency, urgency, bladder spasm and blood in the urine  Please call us  if you have a fever greater than 101.5, uncontrolled nausea/vomiting, uncontrolled pain, dizziness, unable to urinate, excessively bloody urine, chest pain, shortness of breath, leg swelling, leg pain, or any other concerns or questions.   You can reach us  at [phone].   FOLLOW-UP:  1. You will be contacted for a postop follow-up appointment in ~1 month 2. You have a string attached to your stent, the string is tucked into the vagina.  You may remove it on Friday a.m. 01/25/2024. To do this, pull the string until the stent is completely removed. You may feel an odd sensation in your back.  If you are not comfortable removing the stent please contact our office and a time will be arranged for you to come in for stent removal

## 2024-01-22 NOTE — Op Note (Signed)
° °  Preoperative diagnosis:  Right mid ureteral calculus Right nephrolithiasis  Postoperative diagnosis:  Right distal ureteral calculus Right nephrolithiasis  Procedure:  Cystoscopy Right ureteroscopy and stone removal Ureteroscopic laser lithotripsy Right ureteral stent placement (24F/24 cm) Right retrograde pyelography with interpretation  Surgeon: Theresa C. Lou Loewe, M.D.  Anesthesia: General  Complications: None  Intraoperative findings:  Cystoscopy: Bladder mucosa without solid or papillary lesions; UOs normal-appearing bilaterally. Ureteropyeloscopy: Non-impacted right distal ureteral calculus.  3 mm lower pole calculus tucked just inside and anterior calyceal rim.  Was unable to maneuver the basket for removal or laser for dusting Right retrograde pyelography post procedure showed no filling defects, stone fragments or contrast extravasation  EBL: Minimal  Specimens: Calculus fragments for analysis   Indication: Theresa Thomas is a 63 y.o.  female with a 6 mm right mid ureteral calculus with moderate hydronephrosis/upstream hydroureter. After reviewing the management options for treatment, the patient elected to proceed with the above surgical procedure(s). We have discussed the potential benefits and risks of the procedure, side effects of the proposed treatment, the likelihood of the patient achieving the goals of the procedure, and any potential problems that might occur during the procedure or recuperation. Informed consent has been obtained.  Description of procedure:  The patient was taken to the operating room and general anesthesia was induced.  The patient was placed in the dorsal lithotomy position, prepped and draped in the usual sterile fashion, and preoperative antibiotics were administered. A preoperative time-out was performed.   A 21 French cystoscope was lubricated and placed per urethra.  Panendoscopy was performed with findings as described  above  Attention was directed to the right ureteral orifice and a 0.038 Sensor wire was then advanced up the ureter into the renal pelvis under fluoroscopic guidance.  A 4.5 Fr semirigid ureteroscope was then advanced into the ureter next to the guidewire and the calculus was identified.  The stone was then fragmented with a 200 m Moses holmium laser fiber at a setting of 0.3 J/40 hz.   All fragments were then removed from the ureter with a zero tip nitinol basket.  Reinspection of the ureter revealed no remaining visible stones or fragments.   Pullout retrograde pyelogram was performed through the ureteroscope with findings as described above.  The semirigid ureteroscope was removed and a single channel digital flexible ureteroscope was placed per urethra.  The ureteroscope was advanced into the ureter alongside the guidewire and advanced into the renal pelvis.  No ureteral stone fragments were noted.  Contrast was instilled through the ureteroscope and pyeloscopy was then performed.  Each calyx was examined under fluoroscopic guidance with findings as described above.  The 3 mm lower pole calculus was unable to be treated as described above.  The ureteroscope was removed under direct vision and no additional fragments were identified.  A 24F/24 cm Contour ureteral stent with tether was placed under fluoroscopic guidance.  The wire was then removed with the proximal tip of the stent within the lower pole infundibulum and the distal curl in good position.  The tether was cut short and tucked into the vagina.  The bladder was then emptied and the procedure ended.  The patient appeared to tolerate the procedure well and without complications.  After anesthetic reversal the patient was transported to the PACU in stable condition.   Plan: She may remove her stent on Friday morning 01/25/2024 Postop follow-up will be scheduled in ~1 month   Theresa Barba, MD

## 2024-01-22 NOTE — Anesthesia Postprocedure Evaluation (Signed)
 Anesthesia Post Note  Patient: Theresa Thomas  Procedure(s) Performed: CYSTOSCOPY/URETEROSCOPY/HOLMIUM LASER/STENT PLACEMENT (Right)  Patient location during evaluation: PACU Anesthesia Type: General Level of consciousness: awake and alert Pain management: pain level controlled Vital Signs Assessment: post-procedure vital signs reviewed and stable Respiratory status: spontaneous breathing, nonlabored ventilation and respiratory function stable Cardiovascular status: blood pressure returned to baseline and stable Postop Assessment: no apparent nausea or vomiting Anesthetic complications: no   No notable events documented.   Last Vitals:  Vitals:   01/22/24 1645 01/22/24 1700  BP: 117/71   Pulse: 70 69  Resp: 18 20  Temp:  36.5 C  SpO2: 97% 98%    Last Pain:  Vitals:   01/22/24 1700  TempSrc:   PainSc: 0-No pain                 Fairy POUR Tabitha Riggins

## 2024-01-23 ENCOUNTER — Encounter: Payer: Self-pay | Admitting: Urology

## 2024-01-25 ENCOUNTER — Telehealth: Payer: Self-pay

## 2024-01-25 ENCOUNTER — Ambulatory Visit (INDEPENDENT_AMBULATORY_CARE_PROVIDER_SITE_OTHER): Admitting: Urology

## 2024-01-25 DIAGNOSIS — Z9889 Other specified postprocedural states: Secondary | ICD-10-CM | POA: Diagnosis not present

## 2024-01-25 NOTE — Telephone Encounter (Signed)
 Patient calls triage line and states that per her post op instructions she was to remove her ureteral stent today which is on a tether. Pt states that the tether is no long attached to her thigh and she is not able to visualize the strings. Advised pt on the use of a mirror however pt is still unable to see strings. Pt added to nurse schedule for stent pull.

## 2024-01-25 NOTE — Progress Notes (Signed)
 Pt here for help removing her stent. Pt tried at home but was unsuccessful. I was able to have pt butterfly her legs and was able to visualize a little bit of string. I removed pt's stent without any complications and went of possible signs and symptoms after removal. Pt voiced understanding.  Mathew Pinal, RN

## 2024-02-02 LAB — STONE ANALYSIS
Calcium Oxalate Monohydrate: 50 %
Uric Acid Calculi: 50 %
Weight Calculi: 28 mg

## 2024-02-18 NOTE — Progress Notes (Signed)
 " Chief Complaint:   Chief Complaint  Patient presents with   Follow-up    FOLLOW UP    Subjective:  Theresa Thomas is a 64 y.o. female in today for follow up.   Hypothyroidism.  She is on Synthroid  112 ug daily.  TSH was 5.06 and free T4 was 0.74 last check on 08/07/23.  She has lost about 8 lb since the summer; may be less active than usual.  No change in BM's, energy level or temperature sensitivity. HTN.  Is on Atenolol  25 mg daily.  BP has been good when checked but not done recently.  Has no chest pain, SOB, dizziness or swelling in her legs.   Hyperlipidemia.  She is on Atorvastatin  20 mg daily.  Lipids checked 08/07/23 showed TC 127, HDL 45, LDL 46 and TG 180.  Liver enzymes have been slightly high in the past due to fatty liver but were normal last check (08/07/23). No myalgias or GI side effects.   Kidney stones.  She had lithotripsy on the left side this past summer, then had a kidney stone flare-up last month.  She was noted to have a 6 mm stone in the right ureter that was causing hydronephrosis.  She had a stent placed temporarily.  Is urinating normally currently without flank or abdominal pain.  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 CHARM Punch @ [facility] - Nml, FHCC(m)   COLONOSCOPY  10/26/2014   Dr. FABIENE Holmes @ [facility] - Adenomatous Polyps, FHCC(m), CBF 10/2019   KNEE ARTHROSCOPY Left 01/20/2016   Left total knee robotic-assisted Makoplasty   12/31/2017   Dr VEAR Glenn   Right total knee  arthroplasty using computer-assisted navigation  06/01/2021   Dr Mardee   COLONOSCOPY  12/2007   Family History  Problem Relation Name Age of Onset   High blood pressure (Hypertension) Mother Heddy Vidana    Colon cancer Mother Raini Tiley    Breast cancer Mother Camie Slater [facility]    Atrial fibrillation (Abnormal heart rhythm sometimes requiring treatment with blood thinners) Mother Caedence Snowden    Stroke Father Carlin Bolds    Prostate cancer Father Zaelyn Noack    Heart disease Father Emmaly Leech    No Known Problems Sister     High blood pressure (Hypertension) Brother     Hyperlipidemia (Elevated cholesterol) Brother     Kidney disease Brother     Lung cancer Maternal Grandmother     Lung cancer Maternal Grandfather     Heart disease Paternal Grandmother     Breast cancer Paternal Grandmother     High blood pressure (Hypertension) Brother Bard Bolds    Social History   Socioeconomic History   Marital status: Single   Number of children: 0   Years of education: 13  Occupational History    Comment: Retired  Tobacco Use   Smoking status: Never  Smokeless tobacco: Never  Vaping Use   Vaping status: Never Used  Substance and Sexual Activity   Alcohol use: Yes    Alcohol/week: 1.0 - 2.0 standard drink of alcohol    Types: 1 - 2 Standard drinks or equivalent per week   Drug use: Never   Sexual activity: Not Currently    Partners: Male  Social History Narrative   Pt does recumbent bike 6 times a week for 30 minutes. No fast food rarely eats red meat.    Social Drivers of Corporate Investment Banker Strain: Low Risk  (08/07/2023)   Overall Financial Resource Strain (CARDIA)    Difficulty of Paying Living Expenses: Not hard at all  Food Insecurity: No Food Insecurity (08/07/2023)   Hunger Vital Sign    Worried About Running Out of Food in the Last Year: Never true    Ran Out of Food in the Last Year: Never true   Transportation Needs: No Transportation Needs (08/07/2023)   PRAPARE - Administrator, Civil Service (Medical): No    Lack of Transportation (Non-Medical): No  Housing Stability: Low Risk  (08/07/2023)   Housing Stability Vital Sign    Unable to Pay for Housing in the Last Year: No    Number of Times Moved in the Last Year: 0    Homeless in the Last Year: No   Outpatient Medications Prior to Visit  Medication Sig Dispense Refill   acetaminophen  (TYLENOL ) 500 MG tablet Take 1,000 mg by mouth every 6 (six) hours as needed     amoxicillin  (AMOXIL ) 500 MG capsule 2,000 mg 4 CAPS 1 HOUR PRIOR TO DENTAL APPOINTMENT     aspirin 81 MG EC tablet Take 81 mg by mouth once daily     atenolol  (TENORMIN ) 50 MG tablet Take 25 mg by mouth once daily Patient is taking 25 mg     atorvastatin  (LIPITOR) 20 MG tablet TAKE 1 TABLET BY MOUTH AT BEDTIME FOR HIGH CHOLESTEROL 90 tablet 0   levothyroxine  (SYNTHROID ) 112 MCG tablet 1 TAB ORAL ONCE DAILY ON AN EMPTY STOMACH WITH A GLASS OF WATER AT LEAST 30-60 MINUTES BEFORE BREAKFAST. 90 tablet 1   diazePAM  (VALIUM ) 2 MG tablet Take 2 mg by mouth every 8 (eight) hours as needed (Patient not taking: Reported on 02/18/2024)     No facility-administered medications prior to visit.   Allergies  Allergen Reactions   Adhesive Rash   Cortisone Rash    Had a rash that lasted 3 weeks. Was given with marcaine  and xylocaine . Not sure which of the three she had a reaction to.   Ethyl Chloride Rash    Gets a long acting rash from spray that last up to a month   Xylocaine  [Lidocaine  Hcl] Rash    Objective:   Vitals:   02/18/24 1055  BP: 118/78  Pulse: 66  SpO2: 98%  Weight: (!) 123.7 kg (272 lb 12.8 oz)  Height: 167.6 cm (5' 6)  PainSc: 0-No pain   Body mass index is 44.03 kg/m.   GENERAL:  Patient is alert and oriented, in no acute distress.  HEENT:   Conjunctivae are noninjected.  Oropharynx:  Moist mucous membranes.  No erythema  or  lesions. RESPIRATORY:  Normal inspiratory and expiratory effort.  Lungs are clear to auscultation bilaterally. CARDIOVASCULAR:  Regular rate and rhythm, S1, S2, without murmur, rub, or gallop. ABDOMEN:  Nontender and non-distended.   EXTREMITIES:  +2 pulses bilaterally.  No edema.  Assessment/Plan:  Kidney stones.  Follow up with Urology as directed.  Benign essential HTN.  Stay on Atenolol  25 mg daily. Plan: Comprehensive Metabolic Panel (CMP), CBC w/auto  Differential (5 Part)  Hypothyroidism, unspecified type.  Check labs and adjust Synthroid  dose if needed. Plan: Thyroid Stimulating-Hormone (TSH)  Mixed hyperlipidemia.  Stay on Atorvastatin  20 mg daily.  Follow up in 6 months for recheck, sooner as needed for acute concerns.    Alm Na, MD, PhD  "

## 2024-02-20 ENCOUNTER — Encounter: Payer: Self-pay | Admitting: Urology

## 2024-02-20 ENCOUNTER — Ambulatory Visit: Admitting: Urology

## 2024-02-20 VITALS — BP 123/81 | HR 64 | Ht 68.0 in | Wt 265.0 lb

## 2024-02-20 DIAGNOSIS — N200 Calculus of kidney: Secondary | ICD-10-CM

## 2024-02-20 NOTE — Patient Instructions (Signed)

## 2024-02-20 NOTE — Progress Notes (Signed)
 "  02/20/2024 8:52 AM   Theresa Thomas 02-Jul-1960 [id number]  Referring provider: Glover Lenis, MD [phone] [place],  [place] [address]  Chief Complaint  Patient presents with   Nephrolithiasis   Urologic history: 1.  Left nephrolithiasis Left renal calculus identified on hematuria evaluation 09/2022 SWL left renal pelvic calculus 10/25/2023 Ureteroscopic removal of ureteral fragment 01/22/2024   2.  Gross/microhematuria Evaluation August 2024 CT urogram findings as above Cystoscopy unremarkable   HPI: ROSHAWNDA Thomas is a 64 y.o. female presents for postop follow-up.  Status post right ureteroscopy with laser lithotripsy/removal of a 6 mm right distal ureteral calculus 01/22/2024.  She did have a 3 mm lower pole calculus tucked inside an anterior calyceal rim and was unable to access with ureteroscope. Stent was removed 01/25/2024 and she had no problems post stent removal.  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     Surgical History: Past Surgical History:  Procedure Laterality Date   COLONOSCOPY WITH PROPOFOL  N/A 10/26/2014   Procedure: COLONOSCOPY WITH PROPOFOL ;  Surgeon: Lamar ONEIDA Holmes, MD;  Location: [facility] ENDOSCOPY;  Service: Endoscopy;  Laterality: N/A;   COLONOSCOPY WITH PROPOFOL  N/A 02/24/2021   Procedure: COLONOSCOPY WITH PROPOFOL ;  Surgeon: Jinny Carmine, MD;  Location: [facility] ENDOSCOPY;  Service: Endoscopy;  Laterality: N/A;   CYSTOSCOPY/URETEROSCOPY/HOLMIUM LASER/STENT PLACEMENT Right 01/22/2024   Procedure: CYSTOSCOPY/URETEROSCOPY/HOLMIUM LASER/STENT PLACEMENT;  Surgeon: Twylla Glendia BROCKS, MD;  Location: [facility] ORS;  Service:  Urology;  Laterality: Right;   EXTRACORPOREAL SHOCK WAVE LITHOTRIPSY Left 10/25/2023   Procedure: LITHOTRIPSY, ESWL;  Surgeon: Twylla Glendia BROCKS, MD;  Location: [facility] ORS;  Service: Urology;  Laterality: Left;   KNEE ARTHROPLASTY Right 06/01/2021   Procedure: COMPUTER ASSISTED TOTAL KNEE ARTHROPLASTY;  Surgeon: Mardee Lynwood SQUIBB, MD;  Location: [facility] ORS;  Service: Orthopedics;  Laterality: Right;   KNEE ARTHROSCOPY Left 01/20/2016   Procedure: ARTHROSCOPY KNEE, PARTIAL MEDIAL MENISCECTOMY, CHONDROPLASTY;  Surgeon: Helayne Glenn, MD;  Location: [facility] ORS;  Service: Orthopedics;  Laterality: Left;   REPLACEMENT TOTAL KNEE Left 2019    Home Medications:  Allergies as of 02/20/2024       Reactions   Tape Rash   Cortisone Rash   Had a rash that lasted 3 weeks. Was given with marcaine  and xylocaine . Not sure which of the three she had a reaction to.   Ethyl Chloride Rash   Gets a long acting rash from spray that last up to a month   Lidocaine  Hcl Rash        Medication List        Accurate as of February 20, 2024  8:52 AM. If you have any questions, ask your nurse or doctor.          STOP taking these medications    amoxicillin -clavulanate 875-125 MG tablet Commonly known as: AUGMENTIN  Stopped by: Glendia Twylla, MD   HYDROcodone -acetaminophen  5-325 MG tablet Commonly known as: NORCO/VICODIN Stopped by: Glendia Twylla, MD   ondansetron  4 MG tablet Commonly known as: ZOFRAN  Stopped by: Glendia Twylla, MD   oxybutynin  5 MG tablet Commonly known as:  DITROPAN  Stopped by: Glendia Barba, MD   tamsulosin  0.4 MG Caps capsule Commonly known as: FLOMAX  Stopped by: Glendia Barba, MD       TAKE these medications    acetaminophen  500 MG tablet Commonly known as: TYLENOL  Take 1,000 mg by mouth every 6 (six) hours as needed for moderate pain or headache.   aspirin 81 MG chewable tablet Chew 81 mg by mouth daily.   atenolol  25 MG tablet Commonly known as: TENORMIN  Take 25 mg by  mouth daily.   atorvastatin  20 MG tablet Commonly known as: LIPITOR Take 20 mg by mouth at bedtime.   FISH OIL PO Take 1 capsule by mouth daily.   levothyroxine  112 MCG tablet Commonly known as: SYNTHROID  Take 112 mcg by mouth daily before breakfast.        Allergies: Allergies[1]  Family History: Family History  Problem Relation Age of Onset   Breast cancer Mother 43   Kidney cancer Neg Hx    Bladder Cancer Neg Hx     Social History:  reports that she has never smoked. She has never used smokeless tobacco. She reports current alcohol use of about 2.0 standard drinks of alcohol per week. She reports that she does not use drugs.   Physical Exam: BP 123/81   Pulse 64   Ht 5' 8 (1.727 m)   Wt 265 lb (120.2 kg)   BMI 40.29 kg/m   Constitutional:  Alert, No acute distress. HEENT: [place] AT Respiratory: Normal respiratory effort, no increased work of breathing. Psychiatric: Normal mood and affect.    Assessment & Plan:    1. Nephrolithiasis (Primary) Doing well status post ureteroscopic stone removal Recommend metabolic evaluation via [facility]. Will contact with results and follow-up recommendations at that time   Glendia JAYSON Barba, MD  [facility] [address] [place], [place] [address] [phone]    [1]  Allergies Allergen Reactions   Tape Rash   Cortisone Rash    Had a rash that lasted 3 weeks. Was given with marcaine  and xylocaine . Not sure which of the three she had a reaction to.   Ethyl Chloride Rash    Gets a long acting rash from spray that last up to a month   Lidocaine  Hcl Rash   "

## 2024-02-27 ENCOUNTER — Ambulatory Visit: Admitting: Urology
